# Patient Record
Sex: Female | Born: 1960 | Race: White | Hispanic: No | Marital: Married | Smoking: Never smoker
Health system: Southern US, Community
[De-identification: ages and names within clinical notes are randomized; demographics above are authoritative.]

## PROBLEM LIST (undated history)

## (undated) DIAGNOSIS — R51 Headache: Secondary | ICD-10-CM

## (undated) DIAGNOSIS — B019 Varicella without complication: Secondary | ICD-10-CM

## (undated) DIAGNOSIS — K635 Polyp of colon: Secondary | ICD-10-CM

## (undated) DIAGNOSIS — E785 Hyperlipidemia, unspecified: Secondary | ICD-10-CM

## (undated) DIAGNOSIS — G43909 Migraine, unspecified, not intractable, without status migrainosus: Secondary | ICD-10-CM

## (undated) DIAGNOSIS — K5120 Ulcerative (chronic) proctitis without complications: Secondary | ICD-10-CM

## (undated) DIAGNOSIS — K5090 Crohn's disease, unspecified, without complications: Secondary | ICD-10-CM

## (undated) DIAGNOSIS — E119 Type 2 diabetes mellitus without complications: Secondary | ICD-10-CM

## (undated) DIAGNOSIS — F329 Major depressive disorder, single episode, unspecified: Secondary | ICD-10-CM

## (undated) DIAGNOSIS — K644 Residual hemorrhoidal skin tags: Secondary | ICD-10-CM

## (undated) DIAGNOSIS — K219 Gastro-esophageal reflux disease without esophagitis: Secondary | ICD-10-CM

## (undated) DIAGNOSIS — F32A Depression, unspecified: Secondary | ICD-10-CM

## (undated) HISTORY — PX: HERNIA REPAIR: SHX51

## (undated) HISTORY — DX: Major depressive disorder, single episode, unspecified: F32.9

## (undated) HISTORY — DX: Varicella without complication: B01.9

## (undated) HISTORY — DX: Residual hemorrhoidal skin tags: K64.4

## (undated) HISTORY — DX: Depression, unspecified: F32.A

## (undated) HISTORY — DX: Type 2 diabetes mellitus without complications: E11.9

## (undated) HISTORY — PX: TONSILLECTOMY AND ADENOIDECTOMY: SUR1326

## (undated) HISTORY — DX: Polyp of colon: K63.5

## (undated) HISTORY — DX: Headache: R51

## (undated) HISTORY — DX: Hyperlipidemia, unspecified: E78.5

## (undated) HISTORY — DX: Ulcerative (chronic) proctitis without complications: K51.20

## (undated) HISTORY — DX: Crohn's disease, unspecified, without complications: K50.90

## (undated) HISTORY — DX: Migraine, unspecified, not intractable, without status migrainosus: G43.909

## (undated) HISTORY — DX: Gastro-esophageal reflux disease without esophagitis: K21.9

---

## 1995-04-26 HISTORY — PX: ABDOMINAL HYSTERECTOMY: SHX81

## 1995-04-26 HISTORY — PX: APPENDECTOMY: SHX54

## 2004-04-25 HISTORY — PX: COLON SURGERY: SHX602

## 2009-04-22 ENCOUNTER — Ambulatory Visit: Payer: Self-pay

## 2009-04-22 ENCOUNTER — Ambulatory Visit: Payer: Self-pay | Admitting: Pulmonology

## 2009-04-22 ENCOUNTER — Encounter: Payer: Self-pay | Admitting: Gastroenterology

## 2009-04-22 DIAGNOSIS — I519 Heart disease, unspecified: Secondary | ICD-10-CM | POA: Insufficient documentation

## 2009-04-22 DIAGNOSIS — G4733 Obstructive sleep apnea (adult) (pediatric): Secondary | ICD-10-CM | POA: Insufficient documentation

## 2009-04-22 DIAGNOSIS — K5090 Crohn's disease, unspecified, without complications: Secondary | ICD-10-CM | POA: Insufficient documentation

## 2009-04-22 DIAGNOSIS — I5189 Other ill-defined heart diseases: Secondary | ICD-10-CM | POA: Insufficient documentation
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
## 2009-05-07 ENCOUNTER — Emergency Department: Admit: 2009-05-07 | Disposition: A | Discharge status: Home / Self Care | Payer: Self-pay | Source: Ambulatory Visit

## 2009-05-08 ENCOUNTER — Ambulatory Visit: Payer: Self-pay
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
## 2009-05-11 ENCOUNTER — Institutional Professional Consult (permissible substitution): Payer: Self-pay | Admitting: Sleep Medicine

## 2009-05-15 ENCOUNTER — Ambulatory Visit: Payer: Self-pay

## 2009-05-22 ENCOUNTER — Ambulatory Visit: Payer: Self-pay

## 2009-06-02 ENCOUNTER — Other Ambulatory Visit: Payer: Self-pay
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
## 2009-08-05 ENCOUNTER — Other Ambulatory Visit: Payer: Self-pay

## 2009-10-19 ENCOUNTER — Ambulatory Visit: Payer: Self-pay | Admitting: Pulmonology

## 2009-10-19 ENCOUNTER — Ambulatory Visit: Payer: Self-pay

## 2010-04-28 ENCOUNTER — Ambulatory Visit
Admit: 2010-04-28 | Discharge: 2010-04-28 | Disposition: A | Discharge status: Home / Self Care | Payer: Self-pay | Source: Ambulatory Visit | Attending: Obstetrics | Admitting: Obstetrics

## 2010-05-02 NOTE — Miscellaneous (Unsigned)
Continuity of Care Record  Created: todo  From: Hermina Barters  From:   From: [employer], EHR v10.2.7.53  To: [name]  Purpose: Patient Use;       Problems  Diagnosis: Crohn's Disease (555.9)   Diagnosis: Diastolic Dysfunction (429.9)   Diagnosis: Probable Obstructive Sleep Apnea (327.23)     Alerts  Allergy - Latex-asked/denied   Allergy - No Known Drug Allergy     Medications  Accu-Chek Aviva Strip ; RPT   Accu-Chek Multiclix Lancets Miscellaneous ; RPT   Advair Diskus 100-50 MCG/DOSE Aerosol Powder Breath Activated; 1 puff bid ;   RPT   Apriso 0.375 GM Capsule Extended Release 24 Hour; TAKE 4 CAPSULES DAILY IN   THE MORNING. ; RPT   Dicyclomine HCl 20 MG Tablet; TAKE 1 TABLET EVERY 8 HOURS PRN abdominal   pain ; RPT   Glimepiride 1 MG Tablet; TAKE 1 TABLET TWICE DAILY ; RPT   Hydrocodone-Acetaminophen 5-500 MG CAPS; TAKE 1 CAPSULE EVERY 8 HOURS AS   NEEDED FOR PAIN. ; RPT   Hydrocodone-Acetaminophen 7.5-325 MG Tablet ; RPT   HydrOXYzine HCl 25 MG Tablet; TAKE 1 TABLET BEDTIME PRN ; RPT   Ibuprofen 600 MG Tablet ; RPT   Januvia 50 MG Tablet; TAKE 1 TABLET DAILY. ; RPT   Lantus SoloStar 100 UNIT/ML Solution ; RPT   LORazepam 0.5 MG Tablet; TAKE 1 TABLET 3 TIMES DAILY AS NEEDED. ; RPT   Metoprolol Tartrate 50 MG Tablet; TAKE 1 TABLET TWICE DAILY. ; RPT   Nortriptyline HCl 25 MG Capsule; TAKE 1 CAPSULE BEDTIME PRN ; RPT   NovoFine 32G X 6 MM Miscellaneous ; RPT   NovoLOG Mix 70/30 FlexPen 70-30 % Suspension ; RPT   Omeprazole 20 MG Capsule Delayed Release ; RPT   PARoxetine HCl 20 MG Tablet ; RPT   Prochlorperazine Maleate 10 MG Tablet ; RPT   Topiramate 100 MG Tablet; TAKE 1 TABLET DAILY. ; RPT   Ventolin HFA AERS; INHALE 1 TO 2 PUFFS EVERY 6 HOURS AS NEEDED. ; RPT

## 2010-05-03 LAB — GYN CYTOLOGY

## 2010-05-03 LAB — HPV DNA PROBE WITH CYTOLOGY: HPV Hybrid Capture: NEGATIVE

## 2012-06-28 ENCOUNTER — Ambulatory Visit (INDEPENDENT_AMBULATORY_CARE_PROVIDER_SITE_OTHER): Payer: BC Managed Care – PPO | Admitting: Family Medicine

## 2012-06-28 ENCOUNTER — Encounter: Payer: Self-pay | Admitting: Family Medicine

## 2012-06-28 VITALS — BP 124/90 | HR 103 | Temp 98.7°F | Ht 67.5 in | Wt 219.0 lb

## 2012-06-28 DIAGNOSIS — E785 Hyperlipidemia, unspecified: Secondary | ICD-10-CM | POA: Insufficient documentation

## 2012-06-28 DIAGNOSIS — M7071 Other bursitis of hip, right hip: Secondary | ICD-10-CM

## 2012-06-28 DIAGNOSIS — F329 Major depressive disorder, single episode, unspecified: Secondary | ICD-10-CM

## 2012-06-28 DIAGNOSIS — E1142 Type 2 diabetes mellitus with diabetic polyneuropathy: Secondary | ICD-10-CM

## 2012-06-28 DIAGNOSIS — M76899 Other specified enthesopathies of unspecified lower limb, excluding foot: Secondary | ICD-10-CM

## 2012-06-28 DIAGNOSIS — K5090 Crohn's disease, unspecified, without complications: Secondary | ICD-10-CM

## 2012-06-28 DIAGNOSIS — E1149 Type 2 diabetes mellitus with other diabetic neurological complication: Secondary | ICD-10-CM

## 2012-06-28 LAB — BASIC METABOLIC PANEL
BUN: 14 mg/dL (ref 6–23)
Chloride: 101 mEq/L (ref 96–112)
Glucose, Bld: 265 mg/dL — ABNORMAL HIGH (ref 70–99)
Potassium: 4.7 mEq/L (ref 3.5–5.3)

## 2012-06-28 LAB — LIPID PANEL
LDL Cholesterol: 50 mg/dL (ref 0–99)
Triglycerides: 321 mg/dL — ABNORMAL HIGH (ref <150)
VLDL: 64 mg/dL — ABNORMAL HIGH (ref 0–40)

## 2012-06-28 NOTE — Patient Instructions (Addendum)
-  We have ordered labs or studies at this visit. It can take up to 1-2 weeks for results and processing. We will contact you with instructions IF your results are abnormal. Normal results will be released to your [facility]. If you have not heard from [place] or can not find your results in [facility] in 2 weeks please contact our office.  -PLEASE SIGN UP FOR MYCHART TODAY   We recommend the following healthy lifestyle measures: - eat a healthy diet consisting of lots of vegetables, fruits, beans, nuts, seeds, healthy meats such as white chicken and fish and whole grains.  - avoid fried foods, fast food, processed foods, sodas, red meet and other fattening foods.  - get a least 150 minutes of aerobic exercise per week.   For your diabetes:  1) continue lantus and meformin 2) take 5 units of novolog with meals 3)check FASTING blood sugar (first thing in the morning before eating) and keep log 4) check POSTPRANDIAL blood sugars (2 hours after a meal) and keep log -We placed a referral for you as discussed to the endocrinologist. It usually takes about 1-2 weeks to process and schedule this referral. If you have not heard from [place] regarding this appointment in 2 weeks please contact our office.  We placed a referral to the gastroenterologist for you.  For the hip pain: 1)ice for 15 minutes 2 times daily 2) topical sports creams 3) tylenol (500-1000mg ) upt to 3 times daily 4) exercise program provided, start with the stretches for the first week and then add the other exercises  Follow up in: 2 months

## 2012-06-28 NOTE — Progress Notes (Signed)
Chief Complaint  Patient presents with  . Sciatica  . Bursitis    HPI:  Ashley Velazquez is here to establish care. Recently moved here from [place]. Last PCP and physical: Feb 12 with pap and all normal.   Has the following chronic problems and concerns today:  Patient Active Problem List  Diagnosis  . Depression  . Hyperlipemia  . Crohn's disease  . Uncontrolled type 2 diabetes mellitus with peripheral neuropathy   R lateral hip pain: -started a few days ago after a long hike -worse with certain movements -hx of hip bursitis and sciatica -denies: fevers, chills, weakness, numbness  DM: -has had DM for two years -reports until the last 3 weeks diabetes was really out of control with BS in the 500s until got back on medications 3 weeks ago when husband got insurance -taking metformin 1000mg  bid -lantus 80 units daily at night -checking sugars before meals and using sliding scale novolog insulin 5-8 units before meals - but would rather carb count or do a simpler regimen -fasting morning BS ave 100, before meals 120-150s -would like to see an endocrinologist -walking dogs daily about 10 minutes -she is trying to improve diet, she has lost some weight, watching portions -does have some loss of sensation in feet and neuropathy in hands and checks feet frequently  Depression: -on citalopram and Wellbutrin about one years -stable and has been doing well -has not done counseling -has not had SI or hospitalization in the past -goes through spells where depression is worse -denies current instability, depression, hopelessness, SI  HLD: -stable on atorvastatin  Crohn's: -hx of colon resection in 2006 and multiple surgeries after this -wants to get plugged in with a GI doctor here, was followed closely by GI in [place] -not on any medications currenlty -has pains in abd after eating from time to time and when this happens goes on liquid diet for a few days -hx of surgical  adhesions   ROS: See pertinent positives and negatives per HPI.  Past Medical History  Diagnosis Date  . Diabetes mellitus without complication   . Hyperlipidemia   . Crohn disease   . Depression     Family History  Problem Relation Age of Onset  . Cancer Mother     uterin cancer  . Hyperlipidemia Mother   . Alcohol abuse Father   . Cancer Father     lung and brain  . Hyperlipidemia Father   . Heart disease Father   . Hypertension Father   . Drug abuse Maternal Grandmother   . Drug abuse Maternal Grandfather     History   Social History  . Marital Status: Married    Spouse Name: N/A    Number of Children: N/A  . Years of Education: N/A   Social History Main Topics  . Smoking status: Never Smoker   . Smokeless tobacco: None  . Alcohol Use: Yes  . Drug Use: No  . Sexually Active: None   Other Topics Concern  . None   Social History Narrative   Work or School: on permanent disability after multiple surgeries for crohn's disease      Home Situation: lives with husband      Spiritual Beliefs:      Lifestyle: walks daily 10 minutes, working on improving diet                Current outpatient prescriptions:ACCU-CHEK AVIVA PLUS test strip, 1 each by Other route 3 (three) times daily.,  Disp: , Rfl: ;  atorvastatin (LIPITOR) 40 MG tablet, Take 1 tablet by mouth daily., Disp: , Rfl: ;  buPROPion (WELLBUTRIN SR) 150 MG 12 hr tablet, Take 150 mg by mouth daily., Disp: , Rfl: ;  citalopram (CELEXA) 20 MG tablet, Take 1 tablet by mouth daily., Disp: , Rfl:  Lancets (ACCU-CHEK MULTICLIX) lancets, 1 each by Other route 3 (three) times daily., Disp: , Rfl: ;  LANTUS SOLOSTAR 100 UNIT/ML injection, Inject 80 Units into the skin at bedtime., Disp: , Rfl: ;  metFORMIN (GLUCOPHAGE) 1000 MG tablet, Take 1,000 mg by mouth 2 (two) times daily with a meal., Disp: , Rfl: ;  NOVOFINE 32G X 6 MM MISC, 1 each by Other route daily., Disp: , Rfl: ;  NOVOLOG FLEXPEN 100 UNIT/ML injection,  Sliding scale, Disp: , Rfl:   EXAM:  Filed Vitals:   06/28/12 1618  BP: 124/90  Pulse: 103  Temp: 98.7 F (37.1 C)    Body mass index is 33.77 kg/(m^2).  GENERAL: vitals reviewed and listed above, alert, oriented, appears well hydrated and in no acute distress  HEENT: atraumatic, conjunttiva clear, no obvious abnormalities on inspection of external nose and ears  NECK: no obvious masses on inspection  LUNGS: clear to auscultation bilaterally, no wheezes, rales or rhonchi, good air movement  CV: HRRR, no peripheral edema  MS: moves all extremities without noticeable abnormality TTP over R trochanteric bursa Mild difuse TTP low back and glutes Normal MS, DTRs, sensation bilat LEs Neg: facet loading, leg length discrepancy, SLFT, CLRT, FABER, FADIR  PSYCH: pleasant and cooperative, no obvious depression or anxiety  ASSESSMENT AND PLAN:  Discussed the following assessment and plan:  Uncontrolled type 2 diabetes mellitus with peripheral neuropathy - Plan: Ambulatory referral to Endocrinology, Basic metabolic panel, Hemoglobin A1c, Microalbumin / creatinine urine ratio, CANCELED: Hemoglobin A1c, CANCELED: Basic metabolic panel, CANCELED: Microalbumin/Creatinine Ratio, Urine  Crohn's disease - Plan: Ambulatory referral to Gastroenterology  Depression  Hyperlipemia - Plan: Lipid panel, CANCELED: Lipid Panel  Hip bursitis, right  -We reviewed the PMH, PSH, FH, SH, Meds and Allergies. -We provided refills for any medications we will prescribe as needed. -We addressed current concerns per orders and patient instructions. -We have asked for records for pertinent exams, studies, vaccines and notes from previous providers. -hip pain c/w trochanteric bursitis and muscle soreness - tx per below, advised against inj first line given DM  -discussed her DM at length including education on disease process and insulin, diet and exercise - she would like to get plugged in with endocrine  here and referral placed. In the meantime will have her check fasting and postprandial BS and will check HgbA1c today - but is likely to be quite high per report of very high BS until restarted meds 3 weeks ago. Current home BS much improved. -congratulated on lifestyle changes and better management of diabetes -depression, stable and advised if worsening should add counseling -referred to GI for her hx of complicated crohn's disease  -NON-FASTING LABS -We have advised patient to follow up per instructions below. ->45 minutes spent face to face with this patient   -Patient advised to return or notify a doctor immediately if symptoms worsen or persist or new concerns arise.  Patient Instructions  -We have ordered labs or studies at this visit. It can take up to 1-2 weeks for results and processing. We will contact you with instructions IF your results are abnormal. Normal results will be released to your [facility]. If you  have not heard from [place] or can not find your results in [facility] in 2 weeks please contact our office.  -PLEASE SIGN UP FOR MYCHART TODAY   We recommend the following healthy lifestyle measures: - eat a healthy diet consisting of lots of vegetables, fruits, beans, nuts, seeds, healthy meats such as white chicken and fish and whole grains.  - avoid fried foods, fast food, processed foods, sodas, red meet and other fattening foods.  - get a least 150 minutes of aerobic exercise per week.   For your diabetes:  1) continue lantus and meformin 2) take 5 units of novolog with meals 3)check FASTING blood sugar (first thing in the morning before eating) and keep log 4) check POSTPRANDIAL blood sugars (2 hours after a meal) and keep log -We placed a referral for you as discussed to the endocrinologist. It usually takes about 1-2 weeks to process and schedule this referral. If you have not heard from [place] regarding this appointment in 2 weeks please contact our office.  We placed a  referral to the gastroenterologist for you.  For the hip pain: 1)ice for 15 minutes 2 times daily 2) topical sports creams 3) tylenol (500-1000mg ) upt to 3 times daily 4) exercise program provided, start with the stretches for the first week and then add the other exercises  Follow up in: 2 months      KIM, HANNAH R.

## 2012-06-29 LAB — MICROALBUMIN / CREATININE URINE RATIO: Microalb, Ur: 27.28 mg/dL — ABNORMAL HIGH (ref 0.00–1.89)

## 2012-07-02 ENCOUNTER — Telehealth: Payer: Self-pay | Admitting: Family Medicine

## 2012-07-02 NOTE — Telephone Encounter (Signed)
Called and spoke with pt an appt is aware.  Appt made for 09/03/2012 and card sent to pt's home address.

## 2012-07-02 NOTE — Telephone Encounter (Signed)
Labs indicate: Diabetes is very uncontrolled and is damaging kidneys. Diet and exercise will be extremely improtant. She was referred to the endocrinologist for her diabetes. Follow up with me in 2 months.

## 2012-07-05 ENCOUNTER — Encounter: Payer: Self-pay | Admitting: Internal Medicine

## 2012-07-19 ENCOUNTER — Encounter: Payer: Self-pay | Admitting: Internal Medicine

## 2012-07-19 ENCOUNTER — Ambulatory Visit: Payer: BC Managed Care – PPO | Admitting: Internal Medicine

## 2012-07-23 ENCOUNTER — Ambulatory Visit: Payer: BC Managed Care – PPO | Admitting: Internal Medicine

## 2012-08-02 ENCOUNTER — Ambulatory Visit (INDEPENDENT_AMBULATORY_CARE_PROVIDER_SITE_OTHER): Payer: BC Managed Care – PPO | Admitting: Internal Medicine

## 2012-08-02 ENCOUNTER — Encounter: Payer: Self-pay | Admitting: Internal Medicine

## 2012-08-02 VITALS — BP 110/64 | HR 98 | Temp 98.2°F | Resp 10 | Ht 67.5 in | Wt 223.0 lb

## 2012-08-02 DIAGNOSIS — E1142 Type 2 diabetes mellitus with diabetic polyneuropathy: Secondary | ICD-10-CM

## 2012-08-02 DIAGNOSIS — E1149 Type 2 diabetes mellitus with other diabetic neurological complication: Secondary | ICD-10-CM

## 2012-08-02 NOTE — Progress Notes (Signed)
Subjective:     Patient ID: Ashley Velazquez, female   DOB: 12-17-1960, 52 y.o.   MRN: [id number]  HPI Ms. Eilers is a pleasant 52 year old woman, referred by her PCP, Dr. Selena Batten, for management of DM 2, uncontrolled, insulin-dependent, with complications (peripheral neuropathy).  She was dx with DM2 in 2012. She was started on Insulin 6 mo after dx.  She is on: - Lantus 80 units in HS - Novolog: but SSI, but recently started on 5 units before each meal (she remembers to take it with lunch and b'fast).  - Metformin 1000 mg bid She forgets to take the NovoLog and Metformin with dinner, but takes it in am consistently. She does not forget to take her Lantus. She was off meds Aug-Dec 2013 b/c of insurance. At that time, she had blurry vision, increased thirst and urination, and she was drinking at least a regular soda bottle a day. She is having problems with bringing her sugars under control.   She checks her sugars 3x a day: - am: (occ. 86-)112-130 - 2h after lunch: 150-300s Lowest 86, hypoglycemia awareness <100. Highest: HI, when off meds.   Her diet is: - Breakfast: bowl of cereals - Lunch: sandwich - Dinner: meat and vegetables - Snacks: 2-3 a day: Rice cakes, bananas, oranges, yogurt or Jell-O  She has peripheral neuropathy, very dry skin on feet, left shoulder pain and left arm numbness, No CKD, but had MAU at last check, around 150. Last eye exam 2 mo ago.   She has FH of DM2 in 2 aunts.   Review of Systems Constitutional: no weight gain/loss, + fatigue, no subjective hyperthermia/hypothermia, she has poor sleep Eyes: no blurry vision, no xerophthalmia ENT: no sore throat, no nodules palpated in throat, no dysphagia/odynophagia, no hoarseness Cardiovascular: no CP/SOB/palpitations/swelling in hands Respiratory: no cough/+ SOB Gastrointestinal: no N/V/D/C Musculoskeletal: + both muscle/joint aches Skin: no rashes, + itching and hair loss Neurological: no  tremors/numbness/tingling/dizziness, hypoacusis, has headaches Psychiatric: has depression/no anxiety Low libido Past Medical History  Diagnosis Date  . Diabetes mellitus without complication   . Hyperlipidemia   . Crohn disease   . Depression   . Chicken pox   . Depression   . Headache     frequent  . Migraines    Past Surgical History  Procedure Laterality Date  . Abdominal hysterectomy  1997  . Colon surgery    . Appendectomy    . Tonsillectomy    . Tonsillectomy and adenoidectomy     History   Social History  . Marital Status: Married    Spouse Name: N/A    Number of Children: N/A  . Years of Education: N/A   Occupational History  . Not on file.   Social History Main Topics  . Smoking status: Never Smoker   . Smokeless tobacco: Not on file  . Alcohol Use: Yes  . Drug Use: No  . Sexually Active: Not on file   Other Topics Concern  . Not on file   Social History Narrative   Work or School: on permanent disability after multiple surgeries for crohn's disease      Home Situation: lives with husband      Spiritual Beliefs:      Lifestyle: walks daily 10 minutes, working on improving diet               Current Outpatient Prescriptions on File Prior to Visit  Medication Sig Dispense Refill  . ACCU-CHEK AVIVA PLUS test strip 1  each by Other route 3 (three) times daily.      Marland Kitchen atorvastatin (LIPITOR) 40 MG tablet Take 1 tablet by mouth daily.      Marland Kitchen buPROPion (WELLBUTRIN SR) 150 MG 12 hr tablet Take 150 mg by mouth daily.      . citalopram (CELEXA) 20 MG tablet Take 1 tablet by mouth daily.      . Lancets (ACCU-CHEK MULTICLIX) lancets 1 each by Other route 3 (three) times daily.      Marland Kitchen LANTUS SOLOSTAR 100 UNIT/ML injection Inject 80 Units into the skin at bedtime.      . metFORMIN (GLUCOPHAGE) 1000 MG tablet Take 1,000 mg by mouth 2 (two) times daily with a meal.      . NOVOFINE 32G X 6 MM MISC 1 each by Other route daily.      Marland Kitchen NOVOLOG FLEXPEN 100  UNIT/ML injection Sliding scale       No current facility-administered medications on file prior to visit.   Allergies  Allergen Reactions  . Cymbalta (Duloxetine Hcl) Other (See Comments)    tachycardia   Family History  Problem Relation Age of Onset  . Cancer Mother     uterin cancer  . Hyperlipidemia Mother   . Alcohol abuse Father   . Cancer Father     lung and brain  . Hyperlipidemia Father   . Heart disease Father   . Hypertension Father   . Drug abuse Maternal Grandmother   . Drug abuse Maternal Grandfather    Objective:   Physical Exam BP 110/64  Pulse 98  Temp(Src) 98.2 F (36.8 C) (Oral)  Resp 10  Ht 5' 7.5" (1.715 m)  Wt 223 lb (101.152 kg)  BMI 34.39 kg/m2  SpO2 97% Wt Readings from Last 3 Encounters:  08/02/12 223 lb (101.152 kg)  06/28/12 219 lb (99.338 kg)   Constitutional: overweight, in NAD Eyes: PERRLA, EOMI, no exophthalmos ENT: moist mucous membranes, no thyromegaly, no cervical lymphadenopathy Cardiovascular: RRR, No MRG Respiratory: CTA B Gastrointestinal: abdomen soft, NT, ND, BS+ Musculoskeletal: no deformities, strength intact in all 4 Skin: moist, warm, no rashes Neurological: no tremor with outstretched hands, DTR normal in all 4  Assessment:     1. DM2, uncontrolled, insulin-dependent, with complications - Peripheral neuropathy     Plan:     Patient with uncontrolled diabetes, accentuated by lack of insurance for several months last year. She is on a regimen of Lantus and NovoLog along with metformin. She was on a sliding scale plus mealtime NovoLog before, however she is now on a smaller (5 units 3 times a day) fixed dose of mealtime insulin. She does not have frequent lows, and she finds herself mostly hyperglycemic. - we discussed about the fact that the physiologic diabetic regimen would have approximately equal mealtime and basal total daily dose of insulin, so today I will decrease her Lantus dose to 50 units, and will also  increase her NovoLog insulin from 5 units to 10 units 3 times a day with meals. We will also add a sliding scale with a target of 150, and insulin sensitivity factor of 20. - I explained that the best way to constellate her mealtime insulin is by carb counting and having a good insulin to carb ratio, so I will refer her to nutrition, for further information about carb counting and education about healthy eating - given sugar log and advised how to fill it and to bring it at next appt - given foot  care handout and explained the principles - given instructions for hypoglycemia management "15-15 rule" - I will see her back in a month with her sugar log, but I advised her to join my chart and to send me her sugars through there

## 2012-08-02 NOTE — Patient Instructions (Addendum)
Please return in 1 month with your sugar log.  Please: - decrease the Lantus to 50 units. - increase the NovoLog to 10 units of insulin before each meal. - add the following NovoLog Sliding Scale: 150-170: +1 unit 171-190: +2 units 191-210: + 3 units 211-230: + 4 units 231-250: + 5 units 251-270: + 6 units 271 or above: + 7 units  Please let me know about your sugars through my chart, especially if they stay high or decrease to <70.  PATIENT INSTRUCTIONS FOR TYPE 2 DIABETES:  **Please join MyChart!** - see attached instructions about how to join.  DIET AND EXERCISE Diet and exercise is an important part of diabetic treatment.  We recommended aerobic exercise in the form of brisk walking (working between 40-60% of maximal aerobic capacity, similar to brisk walking) for 150 minutes per week (such as 30 minutes five days per week) along with 3 times per week performing 'resistance' training (using various gauge rubber tubes with handles) 5-10 exercises involving the major muscle groups (upper body, lower body and core) performing 10-15 repetitions (or near fatigue) each exercise. Start at half the above goal but build slowly to reach the above goals. If limited by weight, joint pain, or disability, we recommend daily walking in a swimming pool with water up to waist to reduce pressure from joints while allow for adequate exercise.    BLOOD GLUCOSES Monitoring your blood glucoses is important for continued management of your diabetes. Please check your blood glucoses 2-4 times a day: fasting, before meals and at bedtime (you can rotate these measurements - e.g. one day check before the 3 meals, the next day check before 2 of the meals and before bedtime, etc.   HYPOGLYCEMIA (low blood sugar) Hypoglycemia is usually a reaction to not eating, exercising, or taking too much insulin/ other diabetes drugs.  Symptoms include tremors, sweating, hunger, confusion, headache, etc. Treat IMMEDIATELY  with 15 grams of Carbs:   4 glucose tablets    cup regular juice/soda   2 tablespoons raisins   4 teaspoons sugar   1 tablespoon honey Recheck blood glucose in 15 mins and repeat above if still symptomatic/blood glucose <100. Please contact our office at [phone] if you have questions about how to next handle your insulin.  RECOMMENDATIONS TO REDUCE YOUR RISK OF DIABETIC COMPLICATIONS: * Take your prescribed MEDICATION(S). * Follow a DIABETIC diet: Complex carbs, fiber rich foods, heart healthy fish twice weekly, (monounsaturated and polyunsaturated) fats * AVOID saturated/trans fats, high fat foods, >2,300 mg salt per day. * EXERCISE at least 5 times a week for 30 minutes or preferably daily.  * DO NOT SMOKE OR DRINK more than 1 drink a day. * Check your FEET every day. Do not wear tightfitting shoes. Contact us if you develop an ulcer * See your EYE doctor once a year or more if needed * Get a FLU shot once a year * Get a PNEUMONIA vaccine once before and once after age 77 years  GOALS:  * Your Hemoglobin A1c of <7%  * Your Systolic BP should be 140 or lower  * Your Diastolic BP should be 80 or lower  * Your HDL (Good Cholesterol) should be 40 or higher  * Your LDL (Bad Cholesterol) should be 100 or lower  * Your Triglycerides should be 150 or lower  * Your Urine microalbumin (kidney function) should be <30 * Your Body Mass Index should be 25 or lower   We will be glad  to help you achieve these goals. Our telephone number is: [phone].

## 2012-08-21 ENCOUNTER — Telehealth: Payer: Self-pay

## 2012-08-21 DIAGNOSIS — E1165 Type 2 diabetes mellitus with hyperglycemia: Secondary | ICD-10-CM

## 2012-08-21 MED ORDER — INSULIN GLARGINE 100 UNIT/ML SOLOSTAR PEN: 50.0000 [IU] | PEN_INJECTOR | Freq: Every day | SUBCUTANEOUS | Status: DC

## 2012-08-21 MED ORDER — ACCU-CHEK MULTICLIX LANCETS MISC: Status: AC

## 2012-08-21 MED ORDER — GLUCOSE BLOOD VI STRP: ORAL_STRIP | Status: DC

## 2012-08-21 NOTE — Telephone Encounter (Signed)
Rx request for Lantus Solostar 100 u/ml inj.  Pls advise on refill.

## 2012-08-28 ENCOUNTER — Ambulatory Visit: Payer: BC Managed Care – PPO | Admitting: Dietician

## 2012-08-31 ENCOUNTER — Ambulatory Visit: Payer: BC Managed Care – PPO | Admitting: Internal Medicine

## 2012-09-03 ENCOUNTER — Ambulatory Visit: Payer: BC Managed Care – PPO | Admitting: Family Medicine

## 2012-09-06 ENCOUNTER — Ambulatory Visit: Payer: BC Managed Care – PPO | Admitting: Internal Medicine

## 2012-09-07 ENCOUNTER — Other Ambulatory Visit: Payer: Self-pay

## 2012-09-07 MED ORDER — METFORMIN HCL 1000 MG PO TABS: 1000.0000 mg | ORAL_TABLET | Freq: Two times a day (BID) | ORAL | Status: DC

## 2012-09-07 NOTE — Telephone Encounter (Signed)
rx sent to pharmacy

## 2012-09-18 ENCOUNTER — Encounter: Payer: Self-pay | Admitting: Internal Medicine

## 2012-09-19 ENCOUNTER — Encounter: Payer: Self-pay | Admitting: Internal Medicine

## 2012-09-21 ENCOUNTER — Encounter: Payer: Self-pay | Admitting: Internal Medicine

## 2012-09-21 ENCOUNTER — Ambulatory Visit (INDEPENDENT_AMBULATORY_CARE_PROVIDER_SITE_OTHER): Payer: BC Managed Care – PPO | Admitting: Internal Medicine

## 2012-09-21 ENCOUNTER — Other Ambulatory Visit (INDEPENDENT_AMBULATORY_CARE_PROVIDER_SITE_OTHER): Payer: BC Managed Care – PPO

## 2012-09-21 VITALS — BP 102/70 | HR 72 | Ht 67.5 in | Wt 229.0 lb

## 2012-09-21 DIAGNOSIS — R110 Nausea: Secondary | ICD-10-CM

## 2012-09-21 DIAGNOSIS — K219 Gastro-esophageal reflux disease without esophagitis: Secondary | ICD-10-CM

## 2012-09-21 DIAGNOSIS — K5120 Ulcerative (chronic) proctitis without complications: Secondary | ICD-10-CM | POA: Insufficient documentation

## 2012-09-21 DIAGNOSIS — Z8719 Personal history of other diseases of the digestive system: Secondary | ICD-10-CM

## 2012-09-21 LAB — SEDIMENTATION RATE: Sed Rate: 12 mm/hr (ref 0–22)

## 2012-09-21 LAB — C-REACTIVE PROTEIN: CRP: 0.9 mg/dL (ref 0.5–20.0)

## 2012-09-21 LAB — CBC
HCT: 40.7 % (ref 36.0–46.0)
Hemoglobin: 14.1 g/dL (ref 12.0–15.0)
RBC: 4.79 Mil/uL (ref 3.87–5.11)
WBC: 9.1 10*3/uL (ref 4.5–10.5)

## 2012-09-21 LAB — COMPREHENSIVE METABOLIC PANEL
CO2: 27 mEq/L (ref 19–32)
Calcium: 10 mg/dL (ref 8.4–10.5)
Chloride: 103 mEq/L (ref 96–112)
Creatinine, Ser: 0.7 mg/dL (ref 0.4–1.2)
GFR: 93.98 mL/min (ref >60.00)
Glucose, Bld: 234 mg/dL — ABNORMAL HIGH (ref 70–99)
Sodium: 137 mEq/L (ref 135–145)
Total Bilirubin: 0.7 mg/dL (ref 0.3–1.2)
Total Protein: 8 g/dL (ref 6.0–8.3)

## 2012-09-21 MED ORDER — TRAMADOL HCL 50 MG PO TABS: 50.0000 mg | ORAL_TABLET | Freq: Four times a day (QID) | ORAL | Status: DC | PRN

## 2012-09-21 MED ORDER — PANTOPRAZOLE SODIUM 40 MG PO TBEC: 40.0000 mg | DELAYED_RELEASE_TABLET | Freq: Every day | ORAL | Status: DC

## 2012-09-21 MED ORDER — ONDANSETRON 4 MG PO TBDP: 4.0000 mg | ORAL_TABLET | Freq: Three times a day (TID) | ORAL | Status: AC | PRN

## 2012-09-21 NOTE — Patient Instructions (Addendum)
You have been scheduled for a colonoscopy with propofol. Please follow written instructions given to you at your visit today.  Please pick up your prep kit at the pharmacy within the next 1-3 days. If you use inhalers (even only as needed), please bring them with you on the day of your procedure. Your physician has requested that you go to www.startemmi.com and enter the access code given to you at your visit today. This web site gives a general overview about your procedure. However, you should still follow specific instructions given to you by our office regarding your preparation for the procedure.  Your physician has requested that you go to the basement for  lab work before leaving today.  You have been scheduled for a CT scan of the abdomen and pelvis at [facility] ([address]---this is in the same building as Architectural technologist).   You are scheduled on 09/26/2012 at 9:30am. You should arrive 15 minutes prior to your appointment time for registration. Please follow the written instructions below on the day of your exam:  WARNING: IF YOU ARE ALLERGIC TO IODINE/X-RAY DYE, PLEASE NOTIFY RADIOLOGY IMMEDIATELY AT [phone]! YOU WILL BE GIVEN A 13 HOUR PREMEDICATION PREP.  1) Do not eat or drink anything after 5:30am (4 hours prior to your test) 2) You have been given 2 bottles of oral contrast to drink. The solution may taste Better if refrigerated, but do NOT add ice or any other liquid to this solution. Shake well before drinking.    Drink 1 bottle of contrast @ 7:30 (2 hours prior to your exam)  Drink 1 bottle of contrast @ 8:30 (1 hour prior to your exam)  You may take any medications as prescribed with a small amount of water except for the following: Metformin, Glucophage, Glucovance, Avandamet, Riomet, Fortamet, Actoplus Met, Janumet, Glumetza or Metaglip. The above medications must be held the day of the exam AND 48 hours after the exam.  The purpose of you drinking  the oral contrast is to aid in the visualization of your intestinal tract. The contrast solution may cause some diarrhea. Before your exam is started, you will be given a small amount of fluid to drink. Depending on your individual set of symptoms, you may also receive an intravenous injection of x-ray contrast/dye. Plan on being at [facility] for 30 minutes or long, depending on the type of exam you are having performed.  If you have any questions regarding your exam or if you need to reschedule, you may call the CT department at [phone] between the hours of 8:00 am and 5:00 pm, Monday-Friday.                                               We are excited to introduce MyChart, a new best-in-class service that provides you online access to important information in your electronic medical record. We want to make it easier for you to view your health information - all in one secure location - when and where you need it. We expect MyChart will enhance the quality of care and service we provide.  When you register for MyChart, you can:    View your test results.    Request appointments and receive appointment reminders via email.    Request medication renewals.    View your medical history, allergies, medications  and immunizations.    Communicate with your physician's office through a password-protected site.    Conveniently print information such as your medication lists.  To find out if MyChart is right for you, please talk to a member of our clinical staff today. We will gladly answer your questions about this free health and wellness tool.  If you are age 51 or older and want a member of your family to have access to your record, you must provide written consent by completing a proxy form available at our office. Please speak to our clinical staff about guidelines regarding accounts for patients younger than age 45.  As you activate your MyChart account and need any technical  assistance, please call the MyChart technical support line at [phone]) or email your question to [email or web address]. If you email your question(s), please include your name, a return phone number and the best time to reach you.  If you have non-urgent health-related questions, you can send a message to our office through MyChart at [email or web address]. If you have a medical emergency, call 911.  Thank you for using MyChart as your new health and wellness resource!   MyChart licensed from [employer],  [id number]. Patents Pending.    ________________________________________________________________________

## 2012-09-21 NOTE — Progress Notes (Signed)
Patient ID: Ashley Velazquez, female   DOB: October 14, 1960, 52 y.o.   MRN: [id number] HPI: [name] is a 52 yo female with PMH of ileal Crohn's diagnosed in 2006 status post ileocecectomy in September 2006, subsequent diagnosis of ulcerative proctitis, multiple abdominal surgeries between 2006 and 2008 for hernia repair and lysis of adhesion, chronic abdominal pain, hyperlipidemia, and type 2 diabetes mellitus who is seen in consultation at the request of Dr. Selena Velazquez to establish care regarding IBD.  Patient is alone today. She moved from [place] in November 2013 after her husband changed jobs. She was previously followed by Dr. Tamela Velazquez, her gastroenterologist in [place].    She reports she has been off of all IBD medications for several years. Normally for her she has 2-3 soft but formed stools daily which are nonbloody. She reports a chronic abdominal pain in 3 locations which is left lower quadrant, right lower quadrant and mid abdomen near the umbilicus. Over the past 2-3 days she has developed a diarrheal illness consisting of severe nausea and diarrhea. She reports she is now having 8-10 bowel movements daily which are nonbloody and watery. No recent antibiotics. Her chronic abdominal pain has been worse during this period. She notes 3 months of worsening acid reflux and has been using TUMS frequently. In the past she was taking PPI but this was some time ago. She denies mouth ulcers, no significant rashes, she has reported fairly new development of right hip pain which she has been told is bursitis and left shoulder pain.  Her last colonoscopy was in 2011.  She reports her last visit with her gastroenterologist was summer of 2013  Patient Active Problem List   Diagnosis Date Noted  . Depression 06/28/2012  . Hyperlipemia 06/28/2012  . Crohn's disease 06/28/2012  . Uncontrolled type 2 diabetes mellitus with peripheral neuropathy 06/28/2012    Past Surgical History  Procedure  Laterality Date  . Abdominal hysterectomy  1997  . Colon surgery  2006    partial colectomy  . Appendectomy  1997  . Tonsillectomy and adenoidectomy    . Cesarean section  1993, 1995  . Hernia repair  2006/2008    x2    Current Outpatient Prescriptions  Medication Sig Dispense Refill  . atorvastatin (LIPITOR) 40 MG tablet Take 1 tablet by mouth daily.      Marland Kitchen buPROPion (WELLBUTRIN SR) 150 MG 12 hr tablet Take 150 mg by mouth daily.      . citalopram (CELEXA) 20 MG tablet Take 1 tablet by mouth daily.      Marland Kitchen glucose blood (ACCU-CHEK AVIVA PLUS) test strip Check 3 times daily.  100 each  5  . Insulin Glargine (LANTUS SOLOSTAR) 100 UNIT/ML SOPN Inject 50 Units into the skin at bedtime.  5 pen  11  . Lancets (ACCU-CHEK MULTICLIX) lancets Test blood sugar 3 times daily.  100 each  5  . metFORMIN (GLUCOPHAGE) 1000 MG tablet Take 1 tablet (1,000 mg total) by mouth 2 (two) times daily with a meal.  180 tablet  3  . NOVOFINE 32G X 6 MM MISC 1 each by Other route daily.      Marland Kitchen NOVOLOG FLEXPEN 100 UNIT/ML injection Sliding scale       No current facility-administered medications for this visit.    Allergies  Allergen Reactions  . Cymbalta (Duloxetine Hcl) Other (See Comments)    tachycardia    Family History  Problem Relation Age of Onset  . Uterine cancer  Mother     uterin cancer  . Hyperlipidemia Mother   . Alcohol abuse Father   . Cancer Father     lung and brain  . Hyperlipidemia Father   . Heart disease Father   . Hypertension Father   . Drug abuse Maternal Grandmother   . Drug abuse Maternal Grandfather     History  Substance Use Topics  . Smoking status: Never Smoker   . Smokeless tobacco: Never Used  . Alcohol Use: No    ROS: As per history of present illness, otherwise negative  BP 102/70  Pulse 72  Ht 5' 7.5" (1.715 m)  Wt 229 lb (103.874 kg)  BMI 35.32 kg/m2 Constitutional: Well-developed and well-nourished. No distress. HEENT: Normocephalic and  atraumatic. Oropharynx is clear and moist. No oropharyngeal exudate. Conjunctivae are normal.  No scleral icterus. Neck: Neck supple. Trachea midline. Cardiovascular: Normal rate, regular rhythm and intact distal pulses. No M/R/G Pulmonary/chest: Effort normal and breath sounds normal. No wheezing, rales or rhonchi. Abdominal: Soft, moderate to severe diffuse abdominal pain to light palpation without significant rebound or guarding, nondistended. Bowel sounds active throughout. Multiple well-healed abdominal scars Extremities: no clubbing, cyanosis, or edema Lymphadenopathy: No cervical adenopathy noted. Neurological: Alert and oriented to person place and time. Skin: Skin is warm and dry. No rashes noted. Psychiatric: Normal mood and affect. Behavior is normal.  RELEVANT LABS AND IMAGING:  CMP     Component Value Date/Time   NA 136 06/28/2012 1711   K 4.7 06/28/2012 1711   CL 101 06/28/2012 1711   CO2 24 06/28/2012 1711   GLUCOSE 265* 06/28/2012 1711   BUN 14 06/28/2012 1711   CREATININE 0.65 06/28/2012 1711   CALCIUM 9.4 06/28/2012 1711   EGD 08/26/2010, Dr. Alan Velazquez -- normal examination with biopsies for clo.  Pathology fundic type mucosa with minimal chronic inflammation. Negative for H. Pylori Colonoscopy 03/14/2010, Dr. Alan Velazquez -- exam to the terminal ileum, preparation fair. "Probable intra-abdominal adhesions causing fixity of the colon, and difficulty with intubation. Status post ileocolectomy with 2 apparent ileocolonic anastomosis. One entered and it looked normal. Moderate proctitis. No other evidence of colitis. Pathology = colonic mucosa with moderate chronic inflammation and focal acute cryptitis. Changes are consistent with inflammatory bowel disease. No dysplastic changes are seen.  ASSESSMENT/PLAN: 52 yo female with PMH of ileal Crohn's diagnosed in 2006 status post ileocecectomy in September 2006, subsequent diagnosis of ulcerative proctitis, multiple abdominal surgeries between 2006  and 2008 for hernia repair and lysis of adhesion, chronic abdominal pain, hyperlipidemia, and type 2 diabetes mellitus who is seen in consultation at the request of Dr. Selena Velazquez to establish care regarding IBD.  1.  Ileocolonic Crohn's/ulcerative proctitis/chronic abdominal pain felt possibly functional by previous GI M.D. -- it seems that she has an acute illness today which is difficult to determine if this is inflammatory bowel-related or not. She is having significant abdominal pain and diarrhea. I recommended stool studies today to include C. difficile, culture, ova and Pearson I, and fecal leukocytes. I also recommended CT scan of the abdomen and pelvis with contrast to better evaluate her pain and to determine if there is obvious active IBD.  His stool studies and imaging studies are negative, we will proceed to colonoscopy. We discussed the test today including the risks and benefits and she is agreeable proceed. At this time we need further objective evidence given her history before making any determination on how to proceed therapeutically.  She is unsure if she's had  previous Pneumovax, and we will try to obtain these records. With inflammatory bowel disease she would qualify for vaccination with Pneumovax. She also needs bone mineral density testing. -We will check labs today to include CBC, CMP, ESR and CRP  -I will give her a prescription for tramadol 50-100 mg every 6 hours when necessary pain -Zofran 4 mg every 6-8 hours when necessary nausea  2.  GERD -- daily symptoms, I recommended pantoprazole 40 mg daily for now. No other alarm symptoms to warrant upper endoscopy at this time. Previous upper endoscopy negative on 08/26/2010

## 2012-09-25 ENCOUNTER — Ambulatory Visit: Payer: BC Managed Care – PPO

## 2012-09-25 DIAGNOSIS — R110 Nausea: Secondary | ICD-10-CM

## 2012-09-25 DIAGNOSIS — Z8719 Personal history of other diseases of the digestive system: Secondary | ICD-10-CM

## 2012-09-25 DIAGNOSIS — K219 Gastro-esophageal reflux disease without esophagitis: Secondary | ICD-10-CM

## 2012-09-26 ENCOUNTER — Ambulatory Visit (INDEPENDENT_AMBULATORY_CARE_PROVIDER_SITE_OTHER)
Admission: RE | Admit: 2012-09-26 | Discharge: 2012-09-26 | Disposition: A | Discharge status: Home / Self Care | Payer: BC Managed Care – PPO | Source: Ambulatory Visit | Attending: Internal Medicine | Admitting: Internal Medicine

## 2012-09-26 DIAGNOSIS — K219 Gastro-esophageal reflux disease without esophagitis: Secondary | ICD-10-CM

## 2012-09-26 DIAGNOSIS — R110 Nausea: Secondary | ICD-10-CM

## 2012-09-26 DIAGNOSIS — Z8719 Personal history of other diseases of the digestive system: Secondary | ICD-10-CM

## 2012-09-26 LAB — CLOSTRIDIUM DIFFICILE BY PCR: Toxigenic C. Difficile by PCR: NOT DETECTED

## 2012-09-26 LAB — OVA AND PARASITE SCREEN: OP: NONE SEEN

## 2012-09-26 LAB — FECAL LACTOFERRIN, QUANT: Lactoferrin: POSITIVE

## 2012-09-26 MED ORDER — IOHEXOL 300 MG/ML  SOLN
100.0000 mL | Freq: Once | INTRAMUSCULAR | Status: AC | PRN
  Administered 2012-09-26: 100 mL via INTRAVENOUS

## 2012-09-28 ENCOUNTER — Telehealth: Payer: Self-pay | Admitting: *Deleted

## 2012-09-28 MED ORDER — PEG-KCL-NACL-NASULF-NA ASC-C 100 G PO SOLR: 1.0000 | Freq: Once | ORAL | Status: DC

## 2012-09-28 NOTE — Telephone Encounter (Signed)
Message copied by Florene Glen on Fri Sep 28, 2012  2:58 PM ------      Message from: Beverley Fiedler      Created: Thu Sep 27, 2012  1:28 PM       Stool studies show positive wbc's, which can suggest inflammation      C. difficile, of and parasite, and culture negative ------

## 2012-09-28 NOTE — Telephone Encounter (Signed)
Informed pt of stool studies. Pt states the Movi Prep was never ordered; ordered.

## 2012-09-29 LAB — STOOL CULTURE

## 2012-10-01 ENCOUNTER — Ambulatory Visit (INDEPENDENT_AMBULATORY_CARE_PROVIDER_SITE_OTHER)
Admission: RE | Admit: 2012-10-01 | Discharge: 2012-10-01 | Disposition: A | Discharge status: Home / Self Care | Payer: BC Managed Care – PPO | Source: Ambulatory Visit | Attending: Family Medicine | Admitting: Family Medicine

## 2012-10-01 ENCOUNTER — Encounter: Payer: Self-pay | Admitting: Family Medicine

## 2012-10-01 ENCOUNTER — Ambulatory Visit (INDEPENDENT_AMBULATORY_CARE_PROVIDER_SITE_OTHER): Payer: BC Managed Care – PPO | Admitting: Family Medicine

## 2012-10-01 VITALS — BP 118/80 | Temp 98.6°F | Wt 233.0 lb

## 2012-10-01 DIAGNOSIS — M25559 Pain in unspecified hip: Secondary | ICD-10-CM

## 2012-10-01 DIAGNOSIS — M25551 Pain in right hip: Secondary | ICD-10-CM

## 2012-10-01 DIAGNOSIS — M545 Low back pain, unspecified: Secondary | ICD-10-CM

## 2012-10-01 DIAGNOSIS — E1142 Type 2 diabetes mellitus with diabetic polyneuropathy: Secondary | ICD-10-CM

## 2012-10-01 DIAGNOSIS — E1149 Type 2 diabetes mellitus with other diabetic neurological complication: Secondary | ICD-10-CM

## 2012-10-01 DIAGNOSIS — F329 Major depressive disorder, single episode, unspecified: Secondary | ICD-10-CM

## 2012-10-01 NOTE — Progress Notes (Signed)
Chief Complaint  Patient presents with  . right hip pain    HPI:  Acute visit for hip pain:  1) Hip Pain -R sided started after hiking 2-3 months ago and with exercising a lot lately - biking, walking -has tried heat and ice and topical -lateral R hip and radiates down lateral leg, sometimes in buttocks -denies: weakness, numbness, malaise, fevers, bowel or bladder incontinenence  2) Depression: -stable on celexa  3) HLD: -stable -lifestyle recs  )DM, uncontrolled: -followed by endocrinology, taking metformin bid, lantus 50 daily, novolog with meals -reviewed recent notes and changes -has follow up, had to reschedule -BS have been between 140-170  3)IBD: -followed by GI - getting colonoscopy -reviewed recent notes -advised pt of recs for pneumovax  - had pneumonia vaccine in November per her report  Health maintenance:  ROS: See pertinent positives and negatives per HPI.  Past Medical History  Diagnosis Date  . Diabetes mellitus without complication   . Hyperlipidemia   . Crohn disease   . Depression   . Chicken pox   . Depression   . Headache(784.0)     frequent  . Migraines     Family History  Problem Relation Age of Onset  . Uterine cancer Mother     uterin cancer  . Hyperlipidemia Mother   . Alcohol abuse Father   . Cancer Father     lung and brain  . Hyperlipidemia Father   . Heart disease Father   . Hypertension Father   . Drug abuse Maternal Grandmother   . Drug abuse Maternal Grandfather     History   Social History  . Marital Status: Married    Spouse Name: N/A    Number of Children: 2  . Years of Education: N/A   Occupational History  . Disabled    Social History Main Topics  . Smoking status: Never Smoker   . Smokeless tobacco: Never Used  . Alcohol Use: No  . Drug Use: No  . Sexually Active: None   Other Topics Concern  . None   Social History Narrative   Work or School: on permanent disability after multiple surgeries  for crohn's disease      Home Situation: lives with husband      Spiritual Beliefs:      Lifestyle: walks daily 10 minutes, working on improving diet                Current outpatient prescriptions:atorvastatin (LIPITOR) 40 MG tablet, Take 1 tablet by mouth daily., Disp: , Rfl: ;  buPROPion (WELLBUTRIN SR) 150 MG 12 hr tablet, Take 150 mg by mouth daily., Disp: , Rfl: ;  citalopram (CELEXA) 20 MG tablet, Take 1 tablet by mouth daily., Disp: , Rfl: ;  glucose blood (ACCU-CHEK AVIVA PLUS) test strip, Check 3 times daily., Disp: 100 each, Rfl: 5 Insulin Glargine (LANTUS SOLOSTAR) 100 UNIT/ML SOPN, Inject 50 Units into the skin at bedtime., Disp: 5 pen, Rfl: 11;  Lancets (ACCU-CHEK MULTICLIX) lancets, Test blood sugar 3 times daily., Disp: 100 each, Rfl: 5;  metFORMIN (GLUCOPHAGE) 1000 MG tablet, Take 1 tablet (1,000 mg total) by mouth 2 (two) times daily with a meal., Disp: 180 tablet, Rfl: 3;  NOVOFINE 32G X 6 MM MISC, 1 each by Other route daily., Disp: , Rfl:  NOVOLOG FLEXPEN 100 UNIT/ML injection, Sliding scale, Disp: , Rfl: ;  ondansetron (ZOFRAN ODT) 4 MG disintegrating tablet, Take 1 tablet (4 mg total) by mouth every 8 (eight) hours  as needed for nausea., Disp: 20 tablet, Rfl: 0;  pantoprazole (PROTONIX) 40 MG tablet, Take 1 tablet (40 mg total) by mouth daily., Disp: 90 tablet, Rfl: 3;  peg 3350 powder (MOVIPREP) 100 G SOLR, Take 1 kit (100 g total) by mouth once., Disp: 1 kit, Rfl: 0 traMADol (ULTRAM) 50 MG tablet, Take 1 tablet (50 mg total) by mouth every 6 (six) hours as needed for pain., Disp: 30 tablet, Rfl: 0  EXAM:  Filed Vitals:   10/01/12 1348  BP: 118/80  Temp: 98.6 F (37 C)    Body mass index is 35.93 kg/(m^2).  GENERAL: vitals reviewed and listed above, alert, oriented, appears well hydrated and in no acute distress  HEENT: atraumatic, conjunttiva clear, no obvious abnormalities on inspection of external nose and ears  NECK: no obvious masses on  inspection  LUNGS: clear to auscultation bilaterally, no wheezes, rales or rhonchi, good air movement  CV: HRRR, no peripheral edema  MS: moves all extremities without noticeable abnormality -normal gait -normal ROM, strength and sensation in LEs -TTP over greater trochanter and IT band even with light palpation, also in muscles of lumbar back -neg SLRT, CRLT, FABER, FADIR, facet loading, NV intact  PSYCH: pleasant and cooperative, no obvious depression or anxiety  ASSESSMENT AND PLAN:  Discussed the following assessment and plan:  Hip pain, right - Plan: DG Hip Complete Right, Ambulatory referral to Physical Therapy  Low back pain - Plan: DG Lumbar Spine Complete, Ambulatory referral to Physical Therapy  Depression  Uncontrolled type 2 diabetes mellitus with peripheral neuropathy  -suspect soft tissue origin for pain caused by recent increased exercise - will get plain films lumbar spine and hip, PT referral, pain control, return in 3-4 weeks and refer to specialist if not imporving -diabetes appears to be improving, she is to follow up with Dr. Lafe Garin, discussed BS goals, congratulated on lifestyle changes -other chronic issues stable other then undergoing eval with GI currently for her bowel issues and hx IBd -Patient advised to return or notify a doctor immediately if symptoms worsen or persist or new concerns arise.  Patient Instructions  -tylenol 500-1000mg  up to three times daily or ibuprofen 400mg  up to 2 times daily as needed for pain  -We have ordered xrays. It can take up to 1-2 weeks for results and processing. We will contact you with instructions IF your results are abnormal. Normal results will be released to your [facility]. If you have not heard from [place] or can not find your results in [facility] in 2 weeks please contact our office.  --We placed a referral for you as discussed. It usually takes about 1-2 weeks to process and schedule this referral. If you have not heard  from [place] regarding this appointment in 2 weeks please contact our office.   -follow up in 3-4 weeks            KIM, HANNAH R.

## 2012-10-01 NOTE — Patient Instructions (Signed)
-  tylenol 500-1000mg  up to three times daily or ibuprofen 400mg  up to 2 times daily as needed for pain  -We have ordered xrays. It can take up to 1-2 weeks for results and processing. We will contact you with instructions IF your results are abnormal. Normal results will be released to your [facility]. If you have not heard from [place] or can not find your results in [facility] in 2 weeks please contact our office.  --We placed a referral for you as discussed. It usually takes about 1-2 weeks to process and schedule this referral. If you have not heard from [place] regarding this appointment in 2 weeks please contact our office.   -follow up in 3-4 weeks

## 2012-10-01 NOTE — Progress Notes (Signed)
Quick Note:  Called and spoke with pt and pt is aware. ______ 

## 2012-10-04 ENCOUNTER — Ambulatory Visit: Payer: BC Managed Care – PPO

## 2012-10-11 ENCOUNTER — Encounter: Payer: Self-pay | Admitting: Internal Medicine

## 2012-10-11 ENCOUNTER — Ambulatory Visit (AMBULATORY_SURGERY_CENTER): Payer: BC Managed Care – PPO | Admitting: Internal Medicine

## 2012-10-11 VITALS — BP 116/75 | HR 79 | Temp 98.2°F | Resp 22 | Ht 67.5 in | Wt 229.0 lb

## 2012-10-11 DIAGNOSIS — R109 Unspecified abdominal pain: Secondary | ICD-10-CM

## 2012-10-11 DIAGNOSIS — K50919 Crohn's disease, unspecified, with unspecified complications: Secondary | ICD-10-CM

## 2012-10-11 DIAGNOSIS — K5120 Ulcerative (chronic) proctitis without complications: Secondary | ICD-10-CM

## 2012-10-11 DIAGNOSIS — D126 Benign neoplasm of colon, unspecified: Secondary | ICD-10-CM

## 2012-10-11 DIAGNOSIS — K5090 Crohn's disease, unspecified, without complications: Secondary | ICD-10-CM

## 2012-10-11 DIAGNOSIS — R197 Diarrhea, unspecified: Secondary | ICD-10-CM

## 2012-10-11 DIAGNOSIS — Z8719 Personal history of other diseases of the digestive system: Secondary | ICD-10-CM

## 2012-10-11 HISTORY — PX: COLONOSCOPY: SHX174

## 2012-10-11 MED ORDER — HYOSCYAMINE SULFATE 0.125 MG SL SUBL: 0.1250 mg | SUBLINGUAL_TABLET | SUBLINGUAL | Status: AC | PRN

## 2012-10-11 MED ORDER — TRAMADOL HCL 50 MG PO TABS: 50.0000 mg | ORAL_TABLET | Freq: Four times a day (QID) | ORAL | Status: DC | PRN

## 2012-10-11 MED ORDER — SODIUM CHLORIDE 0.9 % IV SOLN: 500.0000 mL | INTRAVENOUS | Status: DC

## 2012-10-11 NOTE — Progress Notes (Signed)
Patient did not experience any of the following events: a burn prior to discharge; a fall within the facility; wrong site/side/patient/procedure/implant event; or a hospital transfer or hospital admission upon discharge from the facility. (G8907) Patient did not have preoperative order for IV antibiotic SSI prophylaxis. (G8918)  

## 2012-10-11 NOTE — Patient Instructions (Addendum)
Discharge instructions given with verbal understanding. Handouts on polyps and hemorrhoids given. Resume previous medications. YOU HAD AN ENDOSCOPIC PROCEDURE TODAY AT THE [facility]: Refer to the procedure report that was given to you for any specific questions about what was found during the examination.  If the procedure report does not answer your questions, please call your gastroenterologist to clarify.  If you requested that your care partner not be given the details of your procedure findings, then the procedure report has been included in a sealed envelope for you to review at your convenience later.  YOU SHOULD EXPECT: Some feelings of bloating in the abdomen. Passage of more gas than usual.  Walking can help get rid of the air that was put into your GI tract during the procedure and reduce the bloating. If you had a lower endoscopy (such as a colonoscopy or flexible sigmoidoscopy) you may notice spotting of blood in your stool or on the toilet paper. If you underwent a bowel prep for your procedure, then you may not have a normal bowel movement for a few days.  DIET: Your first meal following the procedure should be a light meal and then it is ok to progress to your normal diet.  A half-sandwich or bowl of soup is an example of a good first meal.  Heavy or fried foods are harder to digest and may make you feel nauseous or bloated.  Likewise meals heavy in dairy and vegetables can cause extra gas to form and this can also increase the bloating.  Drink plenty of fluids but you should avoid alcoholic beverages for 24 hours.  ACTIVITY: Your care partner should take you home directly after the procedure.  You should plan to take it easy, moving slowly for the rest of the day.  You can resume normal activity the day after the procedure however you should NOT DRIVE or use heavy machinery for 24 hours (because of the sedation medicines used during the test).    SYMPTOMS TO REPORT  IMMEDIATELY: A gastroenterologist can be reached at any hour.  During normal business hours, 8:30 AM to 5:00 PM Monday through Friday, call [phone].  After hours and on weekends, please call the GI answering service at [phone] who will take a message and have the physician on call contact you.   Following lower endoscopy (colonoscopy or flexible sigmoidoscopy):  Excessive amounts of blood in the stool  Significant tenderness or worsening of abdominal pains  Swelling of the abdomen that is new, acute  Fever of 100F or higher FOLLOW UP: If any biopsies were taken you will be contacted by phone or by letter within the next 1-3 weeks.  Call your gastroenterologist if you have not heard about the biopsies in 3 weeks.  Our staff will call the home number listed on your records the next business day following your procedure to check on you and address any questions or concerns that you may have at that time regarding the information given to you following your procedure. This is a courtesy call and so if there is no answer at the home number and we have not heard from you through the emergency physician on call, we will assume that you have returned to your regular daily activities without incident.  SIGNATURES/CONFIDENTIALITY: You and/or your care partner have signed paperwork which will be entered into your electronic medical record.  These signatures attest to the fact that that the information above on your After Visit Summary   has been reviewed and is understood.  Full responsibility of the confidentiality of this discharge information lies with you and/or your care-partner.  

## 2012-10-11 NOTE — Progress Notes (Signed)
Called to room to assist during endoscopic procedure.  Patient ID and intended procedure confirmed with present staff. Received instructions for my participation in the procedure from the performing physician.  

## 2012-10-11 NOTE — Op Note (Signed)
[facility] [address] [place], [address]   COLONOSCOPY PROCEDURE REPORT  PATIENT: Ashley Velazquez, Ashley Velazquez  MR#: [id number] BIRTHDATE: 02/27/1961 , 51  yrs. old GENDER: Female ENDOSCOPIST: Beverley Fiedler, MD REFERRED BY: Kriste Basque, MD PROCEDURE DATE:  10/11/2012 PROCEDURE:   Colonoscopy with snare polypectomy and Colonoscopy with biopsy ASA CLASS:   Class III INDICATIONS:abdominal pain in the lower left quadrant, abdominal pain in the lower right quadrant, Chronic diarrhea, follow up for previously diagnosed Crohn's disease, and follow up for previously diagnosed proctitis ulcerative colitis. MEDICATIONS: MAC sedation, administered by CRNA and propofol (Diprivan) 350mg  IV  DESCRIPTION OF PROCEDURE:   After the risks benefits and alternatives of the procedure were thoroughly explained, informed consent was obtained.  A digital rectal exam revealed no rectal mass.   The LB [id number]  endoscope was introduced through the anus and advanced to the terminal ileum which was intubated for a short distance. No adverse events experienced.   The quality of the prep was good, using MoviPrep  The instrument was then slowly withdrawn as the colon was fully examined.    COLON FINDINGS: The mucosa appeared normal in the neo-terminal ileum.   There was evidence of a normal and healthy appearing prior surgical anastomosis in the ascending colon.   Two sessile polyps measuring 4-5 mm in size were found in the ascending colon and sigmoid colon.  Polypectomy was performed using cold snare.  All resections were complete and all polyp tissue was completely retrieved.   The colonic mucosa appeared normal throughout the entire examined colon with no evidence of active inflammatory bowel disease.  Multiple random biopsies of the area were performed. Retroflexed views revealed external hemorrhoids. The time to cecum=6 minutes 50 seconds.  Withdrawal time=17 minutes 00 seconds. The  scope was withdrawn and the procedure completed.  COMPLICATIONS: There were no complications.     ENDOSCOPIC IMPRESSION: 1.   Normal mucosa in the neo-terminal ileum 2.   There was evidence of prior healthy-appearing surgical anastomosis in the ascending colon 3.   Two sessile polyps measuring 4-5 mm in size were found in the ascending colon and sigmoid colon; Polypectomy was performed using cold snare 4.   The colonic mucosa appeared normal throughout the entire examined colon with no evidence of active inflammatory bowel disease; multiple random biopsies of the area were performed 5.   Small external hemorrhoids  RECOMMENDATIONS: 1.  Await pathology results 2.  Continue current medications 3.  Imodium or Lomotil can be used as needed and as directed for diarrhea 4.  Office followup in about 4 weeks   eSigned:  Beverley Fiedler, MD 10/11/2012 2:13 PM   cc: The Patient; Kriste Basque, MD   PATIENT NAME:  Ashley Velazquez, Ashley Velazquez MR#: [id number]

## 2012-10-11 NOTE — Progress Notes (Signed)
Lidocaine-40mg IV prior to Propofol InductionPropofol given over incremental dosages 

## 2012-10-12 ENCOUNTER — Telehealth: Payer: Self-pay

## 2012-10-12 NOTE — Telephone Encounter (Signed)
Left message on answering machine. 

## 2012-10-16 ENCOUNTER — Telehealth: Payer: Self-pay

## 2012-10-16 MED ORDER — CITALOPRAM HYDROBROMIDE 20 MG PO TABS: ORAL_TABLET | ORAL | Status: DC

## 2012-10-16 MED ORDER — BUPROPION HCL ER (SR) 150 MG PO TB12: 150.0000 mg | ORAL_TABLET | Freq: Every day | ORAL | Status: DC

## 2012-10-16 NOTE — Telephone Encounter (Signed)
Rx for citalopram 20 mg- take 1 to 1/2 tablets by mouth once daily.  Rx request for bupropion xl 150mg  -one tablet by mouth daily.  Pt last seen 10/01/12.  Pls advise.

## 2012-10-16 NOTE — Addendum Note (Signed)
Addended by: Azucena Freed on: 10/16/2012 04:35 PM   Modules accepted: Orders, Medications

## 2012-10-16 NOTE — Telephone Encounter (Signed)
Ok to refill x 6 months 

## 2012-10-17 ENCOUNTER — Ambulatory Visit: Payer: BC Managed Care – PPO | Attending: Family Medicine

## 2012-10-17 ENCOUNTER — Encounter: Payer: Self-pay | Admitting: Internal Medicine

## 2012-10-17 DIAGNOSIS — IMO0001 Reserved for inherently not codable concepts without codable children: Secondary | ICD-10-CM | POA: Insufficient documentation

## 2012-10-17 DIAGNOSIS — M545 Low back pain, unspecified: Secondary | ICD-10-CM | POA: Insufficient documentation

## 2012-10-17 DIAGNOSIS — R5381 Other malaise: Secondary | ICD-10-CM | POA: Insufficient documentation

## 2012-10-17 DIAGNOSIS — M25659 Stiffness of unspecified hip, not elsewhere classified: Secondary | ICD-10-CM | POA: Insufficient documentation

## 2012-10-17 DIAGNOSIS — M25559 Pain in unspecified hip: Secondary | ICD-10-CM | POA: Insufficient documentation

## 2012-10-18 ENCOUNTER — Telehealth: Payer: Self-pay | Admitting: Family Medicine

## 2012-10-18 NOTE — Telephone Encounter (Signed)
Please correct to what she was taking before. Thanks.

## 2012-10-18 NOTE — Telephone Encounter (Signed)
Pls advise which one is correct.

## 2012-10-18 NOTE — Telephone Encounter (Signed)
Pharmacy calling to confirm pt's refill of buPROPion (WELLBUTRIN SR) 150 MG 12 hr tablet. He stated that she has always taken the XR, but this time it was requested for SR. Is this correct? Thank you!

## 2012-10-19 MED ORDER — BUPROPION HCL ER (XL) 150 MG PO TB24: 150.0000 mg | ORAL_TABLET | Freq: Every day | ORAL | Status: DC

## 2012-10-19 NOTE — Telephone Encounter (Signed)
Rx changed and sent to pharmacy.  °

## 2012-10-19 NOTE — Addendum Note (Signed)
Addended by: Azucena Freed on: 10/19/2012 01:27 PM   Modules accepted: Orders, Medications

## 2012-10-22 ENCOUNTER — Other Ambulatory Visit: Payer: Self-pay | Admitting: *Deleted

## 2012-10-29 ENCOUNTER — Ambulatory Visit: Payer: BC Managed Care – PPO | Attending: Family Medicine | Admitting: Physical Therapy

## 2012-10-29 DIAGNOSIS — M25559 Pain in unspecified hip: Secondary | ICD-10-CM | POA: Insufficient documentation

## 2012-10-29 DIAGNOSIS — M545 Low back pain, unspecified: Secondary | ICD-10-CM | POA: Insufficient documentation

## 2012-10-29 DIAGNOSIS — M25659 Stiffness of unspecified hip, not elsewhere classified: Secondary | ICD-10-CM | POA: Insufficient documentation

## 2012-10-29 DIAGNOSIS — IMO0001 Reserved for inherently not codable concepts without codable children: Secondary | ICD-10-CM | POA: Insufficient documentation

## 2012-10-29 DIAGNOSIS — R5381 Other malaise: Secondary | ICD-10-CM | POA: Insufficient documentation

## 2012-11-02 ENCOUNTER — Ambulatory Visit: Payer: BC Managed Care – PPO | Admitting: Family Medicine

## 2012-11-05 ENCOUNTER — Ambulatory Visit: Payer: BC Managed Care – PPO

## 2012-11-06 ENCOUNTER — Encounter: Payer: Self-pay | Admitting: Internal Medicine

## 2012-11-12 ENCOUNTER — Encounter: Payer: Self-pay | Admitting: Family Medicine

## 2012-11-12 ENCOUNTER — Ambulatory Visit (INDEPENDENT_AMBULATORY_CARE_PROVIDER_SITE_OTHER): Payer: BC Managed Care – PPO | Admitting: Family Medicine

## 2012-11-12 ENCOUNTER — Ambulatory Visit: Payer: BC Managed Care – PPO | Admitting: Physical Therapy

## 2012-11-12 VITALS — BP 120/78 | Temp 98.3°F | Wt 233.0 lb

## 2012-11-12 DIAGNOSIS — IMO0002 Reserved for concepts with insufficient information to code with codable children: Secondary | ICD-10-CM

## 2012-11-12 DIAGNOSIS — M25559 Pain in unspecified hip: Secondary | ICD-10-CM

## 2012-11-12 DIAGNOSIS — M25551 Pain in right hip: Secondary | ICD-10-CM

## 2012-11-12 NOTE — Patient Instructions (Addendum)
-  We placed a referral for you as discussed for your hip pain. It usually takes about 1-2 weeks to process and schedule this referral. If you have not heard from [place] regarding this appointment in 2 weeks please contact our office.  -see Dr. Corky Sox for your diabetes  -follow up in 3 months or sooner if needed

## 2012-11-12 NOTE — Progress Notes (Addendum)
Chief Complaint  Patient presents with  . 1 month follow up    PT is not helping; pt is still in pain     HPI:  Follow up:  R Hip and chronic back pain: -obtained palin films last visit showing moderate lumbar DDD with facet arthropathy, normal hip and SI jts per radiology report -referred to PT and conservative tx -R hip pain started 2-3 months ago with exercise, now reports has gone to PT once per week with TENS, ice, stretching and [place] - does not feel like has gotten better -pain is R lateral hip and lateral leg and R buttock - a burning type of pain, worse with activity, nothing makes it better, feels like leg may give out at times -denies: fevers, weight loss, bowel or bladder incontinence  Depression: -takes wellbutrin and citalopram - a little irritable with the pain -no SI  DM: -followed by endocrinology -reports fasting BS improved in 120-140s -will be seeing Dr. Lafe Garin in follow up soon  GERD/?IBS: -followed by GI with recent extensive evaluations   ROS: See pertinent positives and negatives per HPI.  Past Medical History  Diagnosis Date  . Diabetes mellitus without complication   . Hyperlipidemia   . Crohn disease   . Depression   . Chicken pox   . Depression   . Headache(784.0)     frequent  . Migraines   . Colon polyps     Family History  Problem Relation Age of Onset  . Uterine cancer Mother     uterin cancer  . Hyperlipidemia Mother   . Alcohol abuse Father   . Cancer Father     lung and brain  . Hyperlipidemia Father   . Heart disease Father   . Hypertension Father   . Drug abuse Maternal Grandmother   . Drug abuse Maternal Grandfather     History   Social History  . Marital Status: Married    Spouse Name: N/A    Number of Children: 2  . Years of Education: N/A   Occupational History  . Disabled    Social History Main Topics  . Smoking status: Never Smoker   . Smokeless tobacco: Never Used  . Alcohol Use: No  . Drug Use: No   . Sexually Active: None   Other Topics Concern  . None   Social History Narrative   Work or School: on permanent disability after multiple surgeries for crohn's disease      Home Situation: lives with husband      Spiritual Beliefs:      Lifestyle: walks daily 10 minutes, working on improving diet                Current outpatient prescriptions:atorvastatin (LIPITOR) 40 MG tablet, Take 1 tablet by mouth daily., Disp: , Rfl: ;  buPROPion (WELLBUTRIN XL) 150 MG 24 hr tablet, Take 1 tablet (150 mg total) by mouth daily., Disp: 90 tablet, Rfl: 1;  citalopram (CELEXA) 20 MG tablet, Take 1 and 1/2 tablet by mouth once daily., Disp: 135 tablet, Rfl: 1;  glucose blood (ACCU-CHEK AVIVA PLUS) test strip, Check 3 times daily., Disp: 100 each, Rfl: 5 hyoscyamine (LEVSIN SL) 0.125 MG SL tablet, Place 1 tablet (0.125 mg total) under the tongue every 4 (four) hours as needed for cramping., Disp: 30 tablet, Rfl: 2;  Insulin Glargine (LANTUS SOLOSTAR) 100 UNIT/ML SOPN, Inject 50 Units into the skin at bedtime., Disp: 5 pen, Rfl: 11;  Lancets (ACCU-CHEK MULTICLIX) lancets, Test blood  sugar 3 times daily., Disp: 100 each, Rfl: 5 metFORMIN (GLUCOPHAGE) 1000 MG tablet, Take 1 tablet (1,000 mg total) by mouth 2 (two) times daily with a meal., Disp: 180 tablet, Rfl: 3;  MOVIPREP 100 G SOLR, , Disp: , Rfl: ;  NOVOFINE 32G X 6 MM MISC, 1 each by Other route daily., Disp: , Rfl: ;  NOVOLOG FLEXPEN 100 UNIT/ML injection, Sliding scale, Disp: , Rfl:  ondansetron (ZOFRAN ODT) 4 MG disintegrating tablet, Take 1 tablet (4 mg total) by mouth every 8 (eight) hours as needed for nausea., Disp: 20 tablet, Rfl: 0;  pantoprazole (PROTONIX) 40 MG tablet, Take 1 tablet (40 mg total) by mouth daily., Disp: 90 tablet, Rfl: 3;  traMADol (ULTRAM) 50 MG tablet, Take 1 tablet (50 mg total) by mouth every 6 (six) hours as needed for pain., Disp: 30 tablet, Rfl: 2  EXAM:  Filed Vitals:   11/12/12 1023  BP: 120/78  Temp: 98.3 F  (36.8 C)    Body mass index is 35.93 kg/(m^2).  GENERAL: vitals reviewed and listed above, alert, oriented, appears well hydrated and in no acute distress  HEENT: atraumatic, conjunttiva clear, no obvious abnormalities on inspection of external nose and ears  NECK: no obvious masses on inspection  LUNGS: clear to auscultation bilaterally, no wheezes, rales or rhonchi, good air movement  CV: HRRR, no peripheral edema  MS: moves all extremities without noticeable abnormality Normal Gait Normal inspection of back, no obvious scoliosis or leg length descrepancy No bony TTP Soft tissue TTP at: TTP R piriformis and lateral upper thigh (IT band area) -/+ tests: neg trendelenburg,+facet loading lumber R, -SLRT cause pain in R lateral hip, -CLRT, +FABER, + R FADIR Normal muscle strength, sensation to light touch and DTRs in LEs bilaterally - reports possible mild decreased sensation R lateral thigh  PSYCH: pleasant and cooperative, no obvious depression or anxiety  ASSESSMENT AND PLAN:  Discussed the following assessment and plan:  Hip pain, acute, right - Plan: Ambulatory referral to Physical Medicine Rehab  DDD (degenerative disc disease) - Plan: Ambulatory referral to Physical Medicine Rehab  -Character of pain is more nerve like today - query meralgia paresthetica versus radicular, and is worsening despite physical therapy - will have her see PMR to assist - in meantime will hold on PT and advised gentle regular activity, getting BS under control and pain control with tramadol, tylenol and topical products -discuss proper doses and uses and risks -follow up in 3 months -Patient advised to return or notify a doctor immediately if symptoms worsen or persist or new concerns arise.  Patient Instructions  -We placed a referral for you as discussed for your hip pain. It usually takes about 1-2 weeks to process and schedule this referral. If you have not heard from [place] regarding this  appointment in 2 weeks please contact our office.  -see Dr. Corky Sox for your diabetes  -follow up in 3 months or sooner if needed       Pride Gonzales, [facility] R.

## 2012-11-13 ENCOUNTER — Ambulatory Visit: Payer: BC Managed Care – PPO | Admitting: Internal Medicine

## 2012-11-16 ENCOUNTER — Ambulatory Visit: Payer: BC Managed Care – PPO | Admitting: Family Medicine

## 2012-11-19 ENCOUNTER — Encounter: Payer: BC Managed Care – PPO | Admitting: Physical Therapy

## 2012-11-21 ENCOUNTER — Encounter: Payer: Self-pay | Admitting: Internal Medicine

## 2012-11-23 ENCOUNTER — Ambulatory Visit: Payer: BC Managed Care – PPO | Admitting: Internal Medicine

## 2012-11-26 ENCOUNTER — Encounter: Payer: BC Managed Care – PPO | Admitting: Physical Therapy

## 2012-11-28 ENCOUNTER — Encounter: Payer: Self-pay | Admitting: Physical Medicine & Rehabilitation

## 2012-12-12 ENCOUNTER — Telehealth: Payer: Self-pay | Admitting: Family Medicine

## 2012-12-12 NOTE — Telephone Encounter (Signed)
Patient Information:  Caller Name: Aubriel  Phone: [phone]  Patient: Ashley Velazquez, Ashley Velazquez  Gender: Female  DOB: 11/21/1960  Age: 52 Years  PCP: Kriste Basque [facility])  Pregnant: No  Office Follow Up:  Does the office need to follow up with this patient?: Yes  Instructions For The Office: Please call back regarding increasing depression and, if approved, an appointment with Dr Selena Batten for 12/14/12.  RN Note:  Hysterectomy. Lives with husband but home alone all day.  No longer looks forward to husband coming home in the evening.  Not in counseling. At 1400, pt still in PJ's; often wont dress until 1730. Sleeps at intervals during the day.  Feels depression is worsening.  Taking two antidepressants currently.  Editor, commissioning; suggested she use internet to look up participating psychologists for counseling.  Unable to come to office due to transportation until 12/14/12 and prepared to wait until then to be seen. Denies suicidal ideation. Informed Dr Selena Batten is out of the office.  Office staff will discuss with MD on call and call back for appointment 12/14/12 with Dr Selena Batten if approved by MD.  Symptoms  Reason For Call & Symptoms: Worsening depression; crying "over nothing," not sleeping, does not want to dress or shower, or be with people. FBS 120-130.  Reviewed Health History In EMR: Yes  Reviewed Medications In EMR: Yes  Reviewed Allergies In EMR: Yes  Reviewed Surgeries / Procedures: No  Date of Onset of Symptoms: 11/28/2012  Treatments Tried: Wellbutrin, Citalopram  Treatments Tried Worked: No OB / GYN:  LMP: Unknown  Guideline(s) Used:  Depression  Disposition Per Guideline:   Call Local Agency Now  Reason For Disposition Reached:   Patient sounds very upset or severely depressed (e.g., multiple symptoms of depression)  Advice Given:  Reassurance:   People with depression do get through this -- even people who feel as badly as you feel now. You can be helped.  Encourage the  caller to talk about his/her problems and feelings.  Offer hope.  Suggestions for Healthy Living  Communicate: Share how you are feeling with someone in your life who is a good listener. Make certain that your spouse, family, or friends know how you are feeling.  Exercise regularly: Take a daily walk.  Avoid alcohol.  RN Overrode Recommendation:  Follow Up With Office Later  Information sent to office for MD on call

## 2012-12-12 NOTE — Telephone Encounter (Signed)
Please call pt to schedule appt to se Dr. Selena Batten

## 2012-12-12 NOTE — Telephone Encounter (Signed)
Scheduled

## 2012-12-13 ENCOUNTER — Encounter: Payer: Self-pay | Admitting: Family Medicine

## 2012-12-13 ENCOUNTER — Ambulatory Visit (INDEPENDENT_AMBULATORY_CARE_PROVIDER_SITE_OTHER): Payer: BC Managed Care – PPO | Admitting: Family Medicine

## 2012-12-13 VITALS — BP 126/86 | Temp 98.6°F | Wt 228.0 lb

## 2012-12-13 DIAGNOSIS — F3289 Other specified depressive episodes: Secondary | ICD-10-CM

## 2012-12-13 DIAGNOSIS — F329 Major depressive disorder, single episode, unspecified: Secondary | ICD-10-CM

## 2012-12-13 NOTE — Patient Instructions (Signed)
-  schedule appointment with psychiatrist and counselor  -exercise  -increase celexa back to 1.5 tabs daily (30mg ) until see psychiatrist  -seek care immediately if any thoughts of hurting yourself

## 2012-12-13 NOTE — Progress Notes (Signed)
Chief Complaint  Patient presents with  . Depression    HPI:  Acute visit for Worsening Depression: -on two : celexa 20mg  daily and wellbutrin 150xr daily -depression worsening over last 2 weeks -depressed mood despite, no motivation, cries frequently, not interested in seeing spouse and irritable -not getting counseling, no regular exercise -no SI or thoughts of self harm; guns in the home - but she does not know where they are and bullets separate and she has not interest in SI or self harm or harm to others -prior psych hx:  No hx of hospitalization -denies any hx of or current manic symptoms -kids are very supportive, coming to stay with her - they understand better then her husband about depression  ROS: See pertinent positives and negatives per HPI.  Past Medical History  Diagnosis Date  . Diabetes mellitus without complication   . Hyperlipidemia   . Crohn disease   . Depression   . Chicken pox   . Depression   . Headache(784.0)     frequent  . Migraines   . Colon polyps     Family History  Problem Relation Age of Onset  . Uterine cancer Mother     uterin cancer  . Hyperlipidemia Mother   . Alcohol abuse Father   . Cancer Father     lung and brain  . Hyperlipidemia Father   . Heart disease Father   . Hypertension Father   . Drug abuse Maternal Grandmother   . Drug abuse Maternal Grandfather     History   Social History  . Marital Status: Married    Spouse Name: N/A    Number of Children: 2  . Years of Education: N/A   Occupational History  . Disabled    Social History Main Topics  . Smoking status: Never Smoker   . Smokeless tobacco: Never Used  . Alcohol Use: No  . Drug Use: No  . Sexual Activity: None   Other Topics Concern  . None   Social History Narrative   Work or School: on permanent disability after multiple surgeries for crohn's disease      Home Situation: lives with husband      Spiritual Beliefs:      Lifestyle: walks daily  10 minutes, working on improving diet                Current outpatient prescriptions:atorvastatin (LIPITOR) 40 MG tablet, Take 1 tablet by mouth daily., Disp: , Rfl: ;  buPROPion (WELLBUTRIN XL) 150 MG 24 hr tablet, Take 1 tablet (150 mg total) by mouth daily., Disp: 90 tablet, Rfl: 1;  citalopram (CELEXA) 20 MG tablet, Take 1 and 1/2 tablet by mouth once daily., Disp: 135 tablet, Rfl: 1;  glucose blood (ACCU-CHEK AVIVA PLUS) test strip, Check 3 times daily., Disp: 100 each, Rfl: 5 hyoscyamine (LEVSIN SL) 0.125 MG SL tablet, Place 1 tablet (0.125 mg total) under the tongue every 4 (four) hours as needed for cramping., Disp: 30 tablet, Rfl: 2;  Insulin Glargine (LANTUS SOLOSTAR) 100 UNIT/ML SOPN, Inject 50 Units into the skin at bedtime., Disp: 5 pen, Rfl: 11;  Lancets (ACCU-CHEK MULTICLIX) lancets, Test blood sugar 3 times daily., Disp: 100 each, Rfl: 5 metFORMIN (GLUCOPHAGE) 1000 MG tablet, Take 1 tablet (1,000 mg total) by mouth 2 (two) times daily with a meal., Disp: 180 tablet, Rfl: 3;  MOVIPREP 100 G SOLR, , Disp: , Rfl: ;  NOVOFINE 32G X 6 MM MISC, 1 each by Other route  daily., Disp: , Rfl: ;  NOVOLOG FLEXPEN 100 UNIT/ML injection, Sliding scale, Disp: , Rfl:  ondansetron (ZOFRAN ODT) 4 MG disintegrating tablet, Take 1 tablet (4 mg total) by mouth every 8 (eight) hours as needed for nausea., Disp: 20 tablet, Rfl: 0;  pantoprazole (PROTONIX) 40 MG tablet, Take 1 tablet (40 mg total) by mouth daily., Disp: 90 tablet, Rfl: 3;  traMADol (ULTRAM) 50 MG tablet, Take 1 tablet (50 mg total) by mouth every 6 (six) hours as needed for pain., Disp: 30 tablet, Rfl: 2  EXAM:  Filed Vitals:   12/13/12 1021  BP: 126/86  Temp: 98.6 F (37 C)    Body mass index is 35.16 kg/(m^2).  GENERAL: vitals reviewed and listed above, alert, oriented, appears well hydrated and in no acute distress  HEENT: atraumatic, conjunttiva clear, no obvious abnormalities on inspection of external nose and ears  NECK: no  obvious masses on inspection  MS: moves all extremities without noticeable abnormality  PSYCH: pleasant and cooperative, depressed mood  ASSESSMENT AND PLAN:  Discussed the following assessment and plan:  Depression  -25 minutes spent face to face counseling this patient -discussed options and given already on 2 antidepressants with worsening depressive symptoms advised she see a psychiatrist and counselor -advised regular exercise and discussed pool may be the best given her hip issues (she is seeing Dr. Wynn Banker for this next week) -will increase celexa back to 1.5 tabs daily which is what she used to take - discussed risks -return precautions, ED precuations -Patient advised to return or notify a doctor immediately if symptoms worsen or persist or new concerns arise.  Patient Instructions  -schedule appointment with psychiatrist and counselor  -exercise  -increase celexa back to 1.5 tabs daily (30mg ) until see psychiatrist  -seek care immediately if any thoughts of hurting yourself     Ashley Basque R.

## 2012-12-28 ENCOUNTER — Ambulatory Visit: Payer: BC Managed Care – PPO | Admitting: Physical Medicine & Rehabilitation

## 2013-01-16 ENCOUNTER — Other Ambulatory Visit: Payer: Self-pay | Admitting: Internal Medicine

## 2013-01-18 ENCOUNTER — Ambulatory Visit: Payer: BC Managed Care – PPO | Admitting: Physical Medicine & Rehabilitation

## 2013-01-25 ENCOUNTER — Ambulatory Visit: Payer: BC Managed Care – PPO | Admitting: Family Medicine

## 2013-02-08 ENCOUNTER — Encounter: Payer: Self-pay | Admitting: Physical Medicine & Rehabilitation

## 2013-02-08 ENCOUNTER — Ambulatory Visit (HOSPITAL_BASED_OUTPATIENT_CLINIC_OR_DEPARTMENT_OTHER): Payer: BC Managed Care – PPO | Admitting: Physical Medicine & Rehabilitation

## 2013-02-08 ENCOUNTER — Encounter: Payer: BC Managed Care – PPO | Attending: Physical Medicine & Rehabilitation

## 2013-02-08 VITALS — BP 97/67 | HR 67 | Resp 14 | Ht 67.0 in | Wt 231.0 lb

## 2013-02-08 DIAGNOSIS — Z5181 Encounter for therapeutic drug level monitoring: Secondary | ICD-10-CM

## 2013-02-08 DIAGNOSIS — Z79899 Other long term (current) drug therapy: Secondary | ICD-10-CM

## 2013-02-08 DIAGNOSIS — M76899 Other specified enthesopathies of unspecified lower limb, excluding foot: Secondary | ICD-10-CM

## 2013-02-08 DIAGNOSIS — M25559 Pain in unspecified hip: Secondary | ICD-10-CM

## 2013-02-08 DIAGNOSIS — M47817 Spondylosis without myelopathy or radiculopathy, lumbosacral region: Secondary | ICD-10-CM | POA: Insufficient documentation

## 2013-02-08 DIAGNOSIS — M7061 Trochanteric bursitis, right hip: Secondary | ICD-10-CM

## 2013-02-08 NOTE — Progress Notes (Signed)
Subjective:    Patient ID: Ashley Velazquez, female    DOB: [birth year], 52 y.o.   MRN: [id number]  HPI Pain in back and R hip.  Onset in Feb 2014 On and off pain since last year MD in [place] rec acetominophen and hot packs Current exacerbation after hiking in mountains, uphill and downhill equally pain ful  PT ordered by PCP in August, bike on second visit caused increased pain Acetominophen 3 times a day PMH:  Crohn's disease but was told she is in remission by GI in June 2014.  Had true flareup about 1 yr ago, DM Pain Inventory Average Pain 6 Pain Right Now 5 My pain is constant, sharp, burning and tingling  In the last 24 hours, has pain interfered with the following? General activity 3 Relation with others 0 Enjoyment of life 6 What TIME of day is your pain at its worst? all Sleep (in general) Poor  Pain is worse with: walking, bending, sitting, inactivity and standing Pain improves with: therapy/exercise Relief from Meds: 2  Mobility walk without assistance how many minutes can you walk? 10 ability to climb steps?  yes do you drive?  yes  Function disabled: date disabled 2005  Neuro/Psych tingling trouble walking depression anxiety loss of taste or smell  Prior Studies Any changes since last visit?  no  Physicians involved in your care Any changes since last visit?  no   Family History  Problem Relation Age of Onset  . Uterine cancer Mother     uterin cancer  . Hyperlipidemia Mother   . Alcohol abuse Father   . Cancer Father     lung and brain  . Hyperlipidemia Father   . Heart disease Father   . Hypertension Father   . Drug abuse Maternal Grandmother   . Drug abuse Maternal Grandfather    History   Social History  . Marital Status: Married    Spouse Name: N/A    Number of Children: 2  . Years of Education: N/A   Occupational History  . Disabled    Social History Main Topics  . Smoking status: Never Smoker   . Smokeless tobacco: Never  Used  . Alcohol Use: No  . Drug Use: No  . Sexual Activity: None   Other Topics Concern  . None   Social History Narrative   Work or School: on permanent disability after multiple surgeries for crohn's disease      Home Situation: lives with husband      Spiritual Beliefs:      Lifestyle: walks daily 10 minutes, working on improving diet               Past Surgical History  Procedure Laterality Date  . Abdominal hysterectomy  1997  . Colon surgery  2006    partial colectomy  . Appendectomy  1997  . Tonsillectomy and adenoidectomy    . Cesarean section  1993, 1995  . Hernia repair  2006/2008    x2  . Colonoscopy  10/11/2012    Dr. Rhea Belton   Past Medical History  Diagnosis Date  . Diabetes mellitus without complication   . Hyperlipidemia   . Crohn disease   . Depression   . Chicken pox   . Depression   . Headache(784.0)     frequent  . Migraines   . Colon polyps    BP 97/67  Pulse 67  Resp 14  Ht 5\' 7"  (1.702 m)  Wt 231 lb (104.781 kg)  BMI 36.17 kg/m2  SpO2 97%     Review of Systems  Constitutional: Positive for diaphoresis.  Endocrine:       High blood sugars  Musculoskeletal: Positive for back pain and gait problem.       Hip pain  Neurological:       Tingling  Psychiatric/Behavioral: Positive for dysphoric mood. The patient is nervous/anxious.   All other systems reviewed and are negative.       Objective:   Physical Exam  Nursing note and vitals reviewed. Constitutional: She is oriented to person, place, and time. She appears well-developed and well-nourished.  HENT:  Head: Normocephalic and atraumatic.  Eyes: Conjunctivae and EOM are normal. Pupils are equal, round, and reactive to light.  Neurological: She is alert and oriented to person, place, and time.  Psychiatric: She has a normal mood and affect.   Patient right foot reduced to pinprick, nondermatomal  Positive fabers maneuver right side  Negative straight leg raise  test  Tenderness over the right greater trochanter of the femur    Assessment & Plan:  1. Low back and right hip pain appears to be multifactorial: A. Right trochanteric bursitis with tensor fascia lata syndrome, Will inject today B. Probable right sacroiliac dysfunction. Has history of inflammatory bowel disease which can be associated with sacroiliitis. May benefit from fluoroscopic SI injection C. Lumbar spondylosis with or without lumbar degenerative disc, x-rays do show decreased L5-S1 disc space  Trochanteric bursa injection without ultrasound guidance  Indication Trochanteric bursitis. Exam has tenderness over the greater trochanter of the hip. Pain has not responded to conservative care such as exercise therapy and oral medications. Pain interferes with sleep or with mobility Informed consent was obtained after describing risks and benefits of the procedure with the patient these include bleeding bruising and infection. Patient has signed written consent form. Patient placed in a lateral decubitus position with the affected hip superior. Point of maximal pain was palpated marked and prepped with Betadine and entered with a needle to bone contact. Needle slightly withdrawn then 6mg  of betamethasone with 4 cc 1% lidocaine were injected. Patient tolerated procedure well. Post procedure instructions given. RTC 3 weeks

## 2013-02-08 NOTE — Patient Instructions (Signed)

## 2013-02-28 ENCOUNTER — Other Ambulatory Visit: Payer: Self-pay

## 2013-03-15 ENCOUNTER — Encounter: Payer: Self-pay | Admitting: Physical Medicine & Rehabilitation

## 2013-03-15 ENCOUNTER — Ambulatory Visit (HOSPITAL_BASED_OUTPATIENT_CLINIC_OR_DEPARTMENT_OTHER): Payer: BC Managed Care – PPO | Admitting: Physical Medicine & Rehabilitation

## 2013-03-15 ENCOUNTER — Other Ambulatory Visit: Payer: Self-pay

## 2013-03-15 ENCOUNTER — Encounter: Payer: BC Managed Care – PPO | Attending: Physical Medicine & Rehabilitation

## 2013-03-15 VITALS — BP 128/78 | HR 104 | Resp 14 | Ht 61.0 in | Wt 233.0 lb

## 2013-03-15 DIAGNOSIS — M6289 Other specified disorders of muscle: Secondary | ICD-10-CM | POA: Insufficient documentation

## 2013-03-15 DIAGNOSIS — M461 Sacroiliitis, not elsewhere classified: Secondary | ICD-10-CM | POA: Insufficient documentation

## 2013-03-15 DIAGNOSIS — M47817 Spondylosis without myelopathy or radiculopathy, lumbosacral region: Secondary | ICD-10-CM | POA: Insufficient documentation

## 2013-03-15 DIAGNOSIS — M6580 Other synovitis and tenosynovitis, unspecified site: Secondary | ICD-10-CM

## 2013-03-15 DIAGNOSIS — M5137 Other intervertebral disc degeneration, lumbosacral region: Secondary | ICD-10-CM | POA: Insufficient documentation

## 2013-03-15 DIAGNOSIS — M76899 Other specified enthesopathies of unspecified lower limb, excluding foot: Secondary | ICD-10-CM | POA: Insufficient documentation

## 2013-03-15 DIAGNOSIS — M51379 Other intervertebral disc degeneration, lumbosacral region without mention of lumbar back pain or lower extremity pain: Secondary | ICD-10-CM | POA: Insufficient documentation

## 2013-03-15 MED ORDER — TRAMADOL HCL 50 MG PO TABS: 50.0000 mg | ORAL_TABLET | Freq: Every day | ORAL | Status: DC

## 2013-03-15 NOTE — Progress Notes (Signed)
Subjective:    Patient ID: Ashley Velazquez, female    DOB: [birth year], 52 y.o.   MRN: [id number]  HPI Pain in back and R hip. Onset in Feb 2014  On and off pain since last year  MD in [place] rec acetominophen and hot packs  Current exacerbation after hiking in mountains, uphill and downhill equally pain ful  PT ordered by PCP in August, bike on second visit caused increased pain  Acetominophen 3 times a day  PMH: Crohn's disease but was told she is in remission by GI in June 2014. Had true flareup about 1 yr ago, DM  Right hip injection help for about one week and then wore off. Not able to sleep on the right side.  Also complaining as a primary complaint right back and buttocks pain that radiates to the back of the thigh toward the knee  Low back pain is  Moderate, pain is exacerbated by squatting or sitting on floor  Biking exacerbates pain, walking for causes  Worsened hip pain on right Pain Inventory Average Pain 8 Pain Right Now 8 My pain is constant  In the last 24 hours, has pain interfered with the following? General activity 3 Relation with others 0 Enjoyment of life 3 What TIME of day is your pain at its worst? constant Sleep (in general) Poor  Pain is worse with: walking, bending, sitting and some activites Pain improves with: medication Relief from Meds: 1  Mobility walk without assistance how many minutes can you walk? 10 ability to climb steps?  yes do you drive?  yes  Function disabled: date disabled 06/2004  Neuro/Psych spasms depression anxiety  Prior Studies Any changes since last visit?  no  Physicians involved in your care Any changes since last visit?  no   Family History  Problem Relation Age of Onset  . Uterine cancer Mother     uterin cancer  . Hyperlipidemia Mother   . Alcohol abuse Father   . Cancer Father     lung and brain  . Hyperlipidemia Father   . Heart disease Father   . Hypertension Father   . Drug abuse  Maternal Grandmother   . Drug abuse Maternal Grandfather    History   Social History  . Marital Status: Married    Spouse Name: N/A    Number of Children: 2  . Years of Education: N/A   Occupational History  . Disabled    Social History Main Topics  . Smoking status: Never Smoker   . Smokeless tobacco: Never Used  . Alcohol Use: No  . Drug Use: No  . Sexual Activity: None   Other Topics Concern  . None   Social History Narrative   Work or School: on permanent disability after multiple surgeries for crohn's disease      Home Situation: lives with husband      Spiritual Beliefs:      Lifestyle: walks daily 10 minutes, working on improving diet               Past Surgical History  Procedure Laterality Date  . Abdominal hysterectomy  1997  . Colon surgery  2006    partial colectomy  . Appendectomy  1997  . Tonsillectomy and adenoidectomy    . Cesarean section  1993, 1995  . Hernia repair  2006/2008    x2  . Colonoscopy  10/11/2012    Dr. Rhea Belton   Past Medical History  Diagnosis Date  . Diabetes  mellitus without complication   . Hyperlipidemia   . Crohn disease   . Depression   . Chicken pox   . Depression   . Headache(784.0)     frequent  . Migraines   . Colon polyps    BP 128/78  Pulse 104  Resp 14  Ht 5\' 1"  (1.549 m)  Wt 233 lb (105.688 kg)  BMI 44.05 kg/m2  SpO2 99%     Review of Systems  Constitutional: Positive for fever, chills and unexpected weight change.  Musculoskeletal: Positive for back pain.  Psychiatric/Behavioral: Positive for dysphoric mood. The patient is nervous/anxious.   All other systems reviewed and are negative.       Objective:   Physical Exam  Nursing note and vitals reviewed.  Constitutional: She is oriented to person, place, and time. She appears well-developed and well-nourished.  HENT:  Head: Normocephalic and atraumatic.  Eyes: Conjunctivae and EOM are normal. Pupils are equal, round, and reactive to  light.  Neurological: She is alert and oriented to person, place, and time.  Psychiatric: She has a normal mood and affect.   Positive fabers maneuver right side >> Left side with pain at the PSIS area Negative straight leg raise test  Tenderness over the right greater trochanter of the femur Motor strength is 5/5 bilateral hip flexors knee extensors ankle dorsiflexors and plantar flexors      Assessment & Plan:  1. Low back and right hip pain appears to be multifactorial:  A. 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 Probable right sacroiliac dysfunction. Has history of inflammatory bowel disease which can be associated with sacroiliitis. Scheduled for fluoroscopic SI injection  C. Lumbar spondylosis with or without lumbar degenerative disc, x-rays do show decreased L5-S1 disc space, no signs of radiculopathy. I do not think this is a major complaints at this point.

## 2013-03-15 NOTE — Patient Instructions (Signed)

## 2013-04-16 ENCOUNTER — Other Ambulatory Visit: Payer: Self-pay | Admitting: Family Medicine

## 2013-04-16 MED ORDER — INSULIN ASPART 100 UNIT/ML FLEXPEN: PEN_INJECTOR | SUBCUTANEOUS | Status: DC

## 2013-04-16 NOTE — Addendum Note (Signed)
Addended by: Azucena Freed on: 04/16/2013 03:17 PM   Modules accepted: Orders

## 2013-05-02 ENCOUNTER — Ambulatory Visit (HOSPITAL_BASED_OUTPATIENT_CLINIC_OR_DEPARTMENT_OTHER): Payer: BC Managed Care – PPO | Admitting: Physical Medicine & Rehabilitation

## 2013-05-02 ENCOUNTER — Encounter: Payer: BC Managed Care – PPO | Attending: Physical Medicine & Rehabilitation

## 2013-05-02 ENCOUNTER — Encounter: Payer: Self-pay | Admitting: Physical Medicine & Rehabilitation

## 2013-05-02 VITALS — BP 136/80 | HR 93 | Resp 14 | Ht 61.0 in | Wt 234.0 lb

## 2013-05-02 DIAGNOSIS — M76899 Other specified enthesopathies of unspecified lower limb, excluding foot: Secondary | ICD-10-CM | POA: Insufficient documentation

## 2013-05-02 DIAGNOSIS — M461 Sacroiliitis, not elsewhere classified: Secondary | ICD-10-CM

## 2013-05-02 DIAGNOSIS — M47817 Spondylosis without myelopathy or radiculopathy, lumbosacral region: Secondary | ICD-10-CM | POA: Insufficient documentation

## 2013-05-02 MED ORDER — TRAMADOL HCL 50 MG PO TABS: 50.0000 mg | ORAL_TABLET | Freq: Every day | ORAL | Status: DC

## 2013-05-02 NOTE — Progress Notes (Signed)

## 2013-05-02 NOTE — Patient Instructions (Signed)
Sacroiliac injection right side [place] Lidocaine Omnipaque  Please note her pain response. Please rate the percent improvement if possible. Our goal is at least 50% relief of pain in the buttocks hip and back area  Duration of response varies it may improve 4 hours days weeks or months

## 2013-05-02 NOTE — Progress Notes (Signed)
  PROCEDURE RECORD [facility]   Name: Christiane Sistare DOB:1960/08/04 MRN: [id number]  Date:05/02/2013  Physician: Alysia Penna, MD    Nurse/CMA: Vonna Drafts, CMA/[place],CMA  Allergies:  Allergies  Allergen Reactions  . Cymbalta [Duloxetine Hcl] Other (See Comments)    tachycardia    Consent Signed: yes  Is patient diabetic? yes  CBG today? 132  Pregnant: no LMP: No LMP recorded. Patient has had a hysterectomy. (age 52-55)  Anticoagulants: no Anti-inflammatory: no Antibiotics: no  Procedure: sacroiliac injection  Position: Prone Start Time: 11:09 am End Time: 11:12 am  Fluoro Time: 16 seconds  RN/CMA Khadir Roam,CMA Walston,CMA    Time 1019 11:16    BP 136/80 133/68    Pulse 93 86    Respirations 14 14    O2 Sat 98 99    S/S 6 6    Pain Level 7 0     D/C home with Josph Macho , patient A & O X 3, D/C instructions reviewed, and sits independently.

## 2013-05-22 ENCOUNTER — Other Ambulatory Visit: Payer: Self-pay | Admitting: Family Medicine

## 2013-05-27 ENCOUNTER — Telehealth: Payer: Self-pay | Admitting: Family Medicine

## 2013-05-27 DIAGNOSIS — IMO0002 Reserved for concepts with insufficient information to code with codable children: Secondary | ICD-10-CM

## 2013-05-27 DIAGNOSIS — E1142 Type 2 diabetes mellitus with diabetic polyneuropathy: Secondary | ICD-10-CM

## 2013-05-27 DIAGNOSIS — E1165 Type 2 diabetes mellitus with hyperglycemia: Principal | ICD-10-CM

## 2013-05-27 NOTE — Telephone Encounter (Signed)
Noted  

## 2013-05-27 NOTE — Telephone Encounter (Signed)
Patient Information:  Caller Name: [name]  Phone: [phone]  Patient: Ashley Velazquez, Ashley Velazquez  Gender: Female  DOB: [birth year]  Age: 53 Years  PCP: Ashley Velazquez (TEXT 1st, after 20 mins can call), Ashley Velazquez [facility])  Pregnant: No  Office Follow Up:  Does the office need to follow up with this patient?: No  Instructions For The Office: N/A   Symptoms  Reason For Call & Symptoms: Started with sore throat on 05/23/13 and she was feeling nauseous,achy, fatigued and had dry cough. Today her throat is burning up into nose and hurts to drink. She is taking NyQuill to help sleep. She has occasional dry cough. Hx of DM- BG up to 150. She is congested and  has thick yellow nasal drainage. Ear congestion and pain, headaches, and pressure behind R eye. Fever > 3 days.   Reviewed Health History In EMR: Yes  Reviewed Medications In EMR: Yes  Reviewed Allergies In EMR: Yes  Reviewed Surgeries / Procedures: Yes  Date of Onset of Symptoms: 05/23/2013  Treatments Tried: Nyquill  Treatments Tried Worked: No  Any Fever: Yes  Fever Taken: Oral  Fever Time Of Reading: 10:30:00  Fever Last Reading: 101.1 OB / GYN:  LMP: Unknown  Guideline(s) Used:  Influenza - Seasonal  Disposition Per Guideline:   See Today in Office  Reason For Disposition Reached:   Earache  Advice Given:  Reassurance  For most healthy adults, influenza feels like a bad cold. The dangers of influenza for normal, healthy people (under 88 years of age) are overrated.  The treatment of influenza depends on your main symptoms. Generally, treatment is the same as for other viral respiratory infections (colds). Bed rest is unnecessary.  Here is some care advice that should help.  Treating the Symptoms of Flu  Fever, Muscle Aches, and Headache: For fever more than 101 F (38.3 C), muscle aches, and headaches, take acetaminophen every 4-6 hours (Adults 650 mg) OR ibuprofen every 6-8 hours (Adults 400-600 mg).  Sore Throat: Use throat  lozenges, hard candy or warm chicken broth.  Cough: Use cough drops.  Hydrate: Drink extra liquids. If the air in your home is dry, use a humidifier.  Expected Course  : The fever lasts 2-3 days, the runny nose 5-10 days, and the cough 2-3 weeks.  Call Back If:  Fever lasts more than 3 days  Runny nose lasts more than 10 days  Cough lasts more than 3 weeks  Patient Will Follow Care Advice: No appointments available in the office- she would like to go to [facility].

## 2013-05-27 NOTE — Telephone Encounter (Signed)
[facility]rist[facility]en[facility] - [address] requesting new script of Novofine 32G Tip EA #100, last filled 06/16/12

## 2013-05-27 NOTE — Telephone Encounter (Signed)
FYI

## 2013-05-28 MED ORDER — INSULIN PEN NEEDLE 32G X 6 MM MISC: 1.0000 | Freq: Every day | Status: DC

## 2013-05-28 NOTE — Telephone Encounter (Signed)
Rx sent to pharmacy   

## 2013-06-17 ENCOUNTER — Ambulatory Visit: Payer: BC Managed Care – PPO | Admitting: Physical Medicine & Rehabilitation

## 2013-06-22 ENCOUNTER — Other Ambulatory Visit: Payer: Self-pay | Admitting: Family Medicine

## 2013-06-24 NOTE — Telephone Encounter (Signed)
Left a message for pt to return call to make follow up appt and medication will be called in.

## 2013-06-28 ENCOUNTER — Ambulatory Visit (HOSPITAL_BASED_OUTPATIENT_CLINIC_OR_DEPARTMENT_OTHER): Payer: BC Managed Care – PPO | Admitting: Physical Medicine & Rehabilitation

## 2013-06-28 ENCOUNTER — Encounter: Payer: Self-pay | Admitting: Physical Medicine & Rehabilitation

## 2013-06-28 ENCOUNTER — Encounter: Payer: BC Managed Care – PPO | Attending: Physical Medicine & Rehabilitation

## 2013-06-28 VITALS — BP 138/86 | HR 84 | Resp 14 | Ht 67.5 in | Wt 234.0 lb

## 2013-06-28 DIAGNOSIS — K5120 Ulcerative (chronic) proctitis without complications: Secondary | ICD-10-CM

## 2013-06-28 DIAGNOSIS — M461 Sacroiliitis, not elsewhere classified: Secondary | ICD-10-CM

## 2013-06-28 DIAGNOSIS — M47817 Spondylosis without myelopathy or radiculopathy, lumbosacral region: Secondary | ICD-10-CM | POA: Insufficient documentation

## 2013-06-28 DIAGNOSIS — M76899 Other specified enthesopathies of unspecified lower limb, excluding foot: Secondary | ICD-10-CM | POA: Insufficient documentation

## 2013-06-28 MED ORDER — TRAMADOL HCL 50 MG PO TABS: 25.0000 mg | ORAL_TABLET | Freq: Every day | ORAL | Status: AC

## 2013-06-28 NOTE — Progress Notes (Signed)
Subjective:    Patient ID: Ashley Velazquez, female    DOB: [birth year], 53 y.o.   MRN: [id number]  HPI Jan 8  Right sacroiliac injection under fluoroscopic guidance, 100%  Back relief for 2 days Right hip pain improved >50% for 1 month  Has had more left sided pain, similar area to Right   Right> Left symptoms  Pain Inventory Average Pain 7 Pain Right Now 7 My pain is sharp, burning, tingling and aching  In the last 24 hours, has pain interfered with the following? General activity 4 Relation with others 4 Enjoyment of life 4 What TIME of day is your pain at its worst? varies Sleep (in general) Poor  Pain is worse with: walking, bending, inactivity, standing and some activites Pain improves with: n/a Relief from Meds: 1  Mobility walk without assistance how many minutes can you walk? 15 ability to climb steps?  yes do you drive?  yes  Function disabled: date disabled 12/2003  Neuro/Psych dizziness depression anxiety  Prior Studies Any changes since last visit?  no  Physicians involved in your care Any changes since last visit?  no   Family History  Problem Relation Age of Onset  . Uterine cancer Mother     uterin cancer  . Hyperlipidemia Mother   . Alcohol abuse Father   . Cancer Father     lung and brain  . Hyperlipidemia Father   . Heart disease Father   . Hypertension Father   . Drug abuse Maternal Grandmother   . Drug abuse Maternal Grandfather    History   Social History  . Marital Status: Married    Spouse Name: N/A    Number of Children: 2  . Years of Education: N/A   Occupational History  . Disabled    Social History Main Topics  . Smoking status: Never Smoker   . Smokeless tobacco: Never Used  . Alcohol Use: No  . Drug Use: No  . Sexual Activity: None   Other Topics Concern  . None   Social History Narrative   Work or School: on permanent disability after multiple surgeries for crohn's disease      Home Situation:  lives with husband      Spiritual Beliefs:      Lifestyle: walks daily 10 minutes, working on improving diet               Past Surgical History  Procedure Laterality Date  . Abdominal hysterectomy  1997  . Colon surgery  2006    partial colectomy  . Appendectomy  1997  . Tonsillectomy and adenoidectomy    . Cesarean section  1993, 1995  . Hernia repair  2006/2008    x2  . Colonoscopy  10/11/2012    Dr. Hilarie Fredrickson   Past Medical History  Diagnosis Date  . Diabetes mellitus without complication   . Hyperlipidemia   . Crohn disease   . Depression   . Chicken pox   . Depression   . Headache(784.0)     frequent  . Migraines   . Colon polyps    BP 138/86  Pulse 84  Resp 14  Ht 5' 7.5" (1.715 m)  Wt 234 lb (106.142 kg)  BMI 36.09 kg/m2  SpO2 97%  Opioid Risk Score:   Fall Risk Score: Low Fall Risk (0-5 points) (patient educated handout declined)    Review of Systems  Constitutional: Positive for diaphoresis.  Respiratory: Positive for shortness of breath.   Gastrointestinal:  Positive for nausea, abdominal pain and diarrhea.  Neurological: Positive for dizziness.  Psychiatric/Behavioral: Positive for dysphoric mood. The patient is nervous/anxious.   All other systems reviewed and are negative.       Objective:   Physical Exam  Positive fabers maneuver right side >> Left side with pain at the PSIS area Negative straight leg raise test  Tenderness over the right greater trochanter of the femur Motor strength is 5/5 bilateral hip flexors knee extensors ankle dorsiflexors and plantar flexors      Assessment & Plan:  1.  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 No other symptoms to suggest a more widespread rheumatologic disease. The patient has a history of ulcerative proctitis  which may be associated with sacroiliitis And  states would likely be beneficial however relatively contraindicated with inflammatory bowel disease Patient does not tolerate tramadol very well at night and 50 mg does have a hangover affect. Will produce 25 mg each bedtime

## 2013-06-28 NOTE — Patient Instructions (Signed)
Sacroiliac Joint Dysfunction  The sacroiliac joint connects the lower part of the spine (the sacrum) with the bones of the pelvis.  CAUSES   Sometimes, there is no obvious reason for sacroiliac joint dysfunction. Other times, it may occur    During pregnancy.   After injury, such as:   Car accidents.   Sport-related injuries.   Work-related injuries.   Due to one leg being shorter than the other.   Due to other conditions that affect the joints, such as:   Rheumatoid arthritis.   Gout.   Psoriasis.   Joint infection (septic arthritis).  SYMPTOMS   Symptoms may include:   Pain in the:   Lower back.   Buttocks.   Groin.   Thighs and legs.   Difficult sitting, standing, walking, lying, bending or lifting.  DIAGNOSIS   A number of tests may be used to help diagnose the cause of sacroiliac joint dysfunction, including:   Imaging tests to look for other causes of pain, including:   MRI.   CT scan.   Bone scan.   Diagnostic injection: During a special x-ray (called fluoroscopy), a needle is put into the sacroiliac joint. A numbing medicine is injected into the joint. If the pain is improved or stopped, the diagnosis of sacroiliac joint dysfunction is more likely.  TREATMENT   There are a number of types of treatment used for sacroiliac joint dysfunction, including:   Only take over-the-counter or prescription medicines for pain, discomfort, or fever as directed by your caregiver.   Medications to relax muscles.   Rest. Decreasing activity can help cut down on painful muscle spasms and allow the back to heal.   Application of heat or ice to the lower back may improve muscle spasms and soothe pain.   Brace. A special back brace, called a sacroiliac belt, can help support the joint while your back is healing.   Physical therapy can help teach comfortable positions and exercises to strengthen muscles that support the sacroiliac joint.   Cortisone injections. Injections of steroid medicine into the  joint can help decrease swelling and improve pain.   Hyaluronic acid injections. This chemical improves lubrication within the sacroiliac joint, thereby decreasing pain.   Radiofrequency ablation. A special needle is placed into the joint, where it burns away nerves that are carrying pain messages from the joint.   Surgery. Because pain occurs during movement of the joint, screws and plates may be installed in order to limit or prevent joint motion.  HOME CARE INSTRUCTIONS    Take all medications exactly as directed.   Follow instructions regarding both rest and physical activity, to avoid worsening the pain.   Do physical therapy exercises exactly as prescribed.  SEEK IMMEDIATE MEDICAL CARE IF:   You experience increasingly severe pain.   You develop new symptoms, such as numbness or tingling in your legs or feet.   You lose bladder or bowel control.  Document Released: 07/08/2008 Document Revised: 07/04/2011 Document Reviewed: 07/08/2008  ExitCare Patient Information 2014 [employer].

## 2013-07-16 ENCOUNTER — Ambulatory Visit: Payer: BC Managed Care – PPO | Admitting: Physical Medicine & Rehabilitation

## 2013-07-19 ENCOUNTER — Ambulatory Visit: Payer: BC Managed Care – PPO | Admitting: Physical Medicine & Rehabilitation

## 2013-07-23 ENCOUNTER — Other Ambulatory Visit: Payer: Self-pay | Admitting: Family Medicine

## 2013-08-01 ENCOUNTER — Encounter: Payer: BC Managed Care – PPO | Attending: Physical Medicine & Rehabilitation

## 2013-08-01 ENCOUNTER — Encounter: Payer: Self-pay | Admitting: Physical Medicine & Rehabilitation

## 2013-08-01 ENCOUNTER — Ambulatory Visit (HOSPITAL_BASED_OUTPATIENT_CLINIC_OR_DEPARTMENT_OTHER): Payer: BC Managed Care – PPO | Admitting: Physical Medicine & Rehabilitation

## 2013-08-01 VITALS — BP 126/78 | HR 77 | Resp 14 | Ht 67.5 in | Wt 232.6 lb

## 2013-08-01 DIAGNOSIS — M461 Sacroiliitis, not elsewhere classified: Secondary | ICD-10-CM

## 2013-08-01 DIAGNOSIS — M76899 Other specified enthesopathies of unspecified lower limb, excluding foot: Secondary | ICD-10-CM | POA: Insufficient documentation

## 2013-08-01 DIAGNOSIS — M47817 Spondylosis without myelopathy or radiculopathy, lumbosacral region: Secondary | ICD-10-CM | POA: Insufficient documentation

## 2013-08-01 NOTE — Progress Notes (Signed)
Bilateral sacroiliac injections under fluoroscopic guidance, 100% relief with R SI injection Jan 2015 which has worn off, pt with bilateral symptoms  Indication: Low back and buttocks pain not relieved by medication management and other conservative care.  Informed consent was obtained after describing risks and benefits of the procedure with the patient, this includes bleeding, bruising, infection, paralysis and medication side effects. The patient wishes to proceed and has given written consent. The patient was placed in a prone position. The lumbar and sacral area was marked and prepped with Betadine. A 25-gauge 1-1/2 inch needle was inserted into the skin and subcutaneous tissue and 1 mL of 1% lidocaine was injected into each side. Then a 25-gauge 3 inch spinal needle was inserted under fluoroscopic guidance into the left sacroiliac joint. AP and lateral images were utilized. Omnipaque 180x0.5 mL under live fluoroscopy demonstrated no intravascular uptake. Then a solution containing one ML of 40 mg per mL Depakote met drawl in 2 ML of 2% lidocaine MPF was injected x1.5 mL. This same procedure was repeated on the right side using the same needle, injectate, and technique. Patient tolerated the procedure well. Post procedure instructions were given. Please see post procedure form.

## 2013-08-01 NOTE — Patient Instructions (Signed)

## 2013-08-01 NOTE — Progress Notes (Signed)
  PROCEDURE RECORD [facility]   Name: Seher Schlagel DOB:12-03-1960 MRN: [id number]  Date:08/01/2013  Physician: Alysia Penna, MD    Nurse/CMA: Shumaker RN  Allergies:  Allergies  Allergen Reactions  . Cymbalta [Duloxetine Hcl] Other (See Comments)    tachycardia    Consent Signed: yes  Is patient diabetic? yes  CBG today? 132  Pregnant: no LMP: No LMP recorded. Patient has had a hysterectomy. (age 53-55)  Anticoagulants: no Anti-inflammatory: no Antibiotics: no  Procedure: Bilateral Sacroiliac Steroid injection Position: Prone Start Time: 9:21 End Time: 9:27  Fluoro Time: 19 sec  RN/CMA Biomedical engineer    Time 8:58 9:30    BP 126/76 142/57    Pulse 77 89    Respirations 14 14    O2 Sat 97 98    S/S 6 6    Pain Level 7/10 5/10     D/C home with husband Josph Macho, patient A & O X 3, D/C instructions reviewed, and sits independently.

## 2013-08-21 ENCOUNTER — Other Ambulatory Visit: Payer: Self-pay | Admitting: Family Medicine

## 2013-08-21 NOTE — Telephone Encounter (Signed)
For Dr. Maudie Mercury

## 2013-08-23 ENCOUNTER — Telehealth: Payer: Self-pay

## 2013-08-23 NOTE — Telephone Encounter (Signed)
Pt called and i was able to schedule an appt with dr. Maudie Mercury, so that pt could get med refilled

## 2013-08-23 NOTE — Telephone Encounter (Signed)
Called pt in regards to refill notification, and wanted to let her know that she needed to schedule an appt to receive further refills. Unable to reach pt at this time

## 2013-09-09 ENCOUNTER — Ambulatory Visit: Payer: BC Managed Care – PPO | Admitting: Physical Medicine & Rehabilitation

## 2013-09-11 ENCOUNTER — Ambulatory Visit: Payer: BC Managed Care – PPO | Admitting: Family Medicine

## 2013-09-14 ENCOUNTER — Other Ambulatory Visit: Payer: Self-pay | Admitting: Family Medicine

## 2013-09-14 ENCOUNTER — Other Ambulatory Visit: Payer: Self-pay | Admitting: Internal Medicine

## 2013-09-16 NOTE — Telephone Encounter (Signed)
Needs appt for further refills.

## 2013-09-24 ENCOUNTER — Ambulatory Visit (INDEPENDENT_AMBULATORY_CARE_PROVIDER_SITE_OTHER): Payer: BC Managed Care – PPO | Admitting: Family Medicine

## 2013-09-24 ENCOUNTER — Encounter: Payer: Self-pay | Admitting: Family Medicine

## 2013-09-24 VITALS — BP 120/86 | HR 101 | Temp 98.4°F | Ht 67.5 in | Wt 234.0 lb

## 2013-09-24 DIAGNOSIS — E1142 Type 2 diabetes mellitus with diabetic polyneuropathy: Secondary | ICD-10-CM

## 2013-09-24 DIAGNOSIS — F32A Depression, unspecified: Secondary | ICD-10-CM

## 2013-09-24 DIAGNOSIS — E785 Hyperlipidemia, unspecified: Secondary | ICD-10-CM

## 2013-09-24 DIAGNOSIS — K5090 Crohn's disease, unspecified, without complications: Secondary | ICD-10-CM

## 2013-09-24 DIAGNOSIS — E1165 Type 2 diabetes mellitus with hyperglycemia: Secondary | ICD-10-CM

## 2013-09-24 DIAGNOSIS — IMO0002 Reserved for concepts with insufficient information to code with codable children: Secondary | ICD-10-CM

## 2013-09-24 DIAGNOSIS — E1149 Type 2 diabetes mellitus with other diabetic neurological complication: Secondary | ICD-10-CM

## 2013-09-24 DIAGNOSIS — F329 Major depressive disorder, single episode, unspecified: Secondary | ICD-10-CM

## 2013-09-24 DIAGNOSIS — F3289 Other specified depressive episodes: Secondary | ICD-10-CM

## 2013-09-24 MED ORDER — CITALOPRAM HYDROBROMIDE 20 MG PO TABS: 30.0000 mg | ORAL_TABLET | Freq: Every day | ORAL | Status: DC

## 2013-09-24 MED ORDER — ATORVASTATIN CALCIUM 40 MG PO TABS: 40.0000 mg | ORAL_TABLET | Freq: Every day | ORAL | Status: AC

## 2013-09-24 MED ORDER — BUPROPION HCL ER (XL) 150 MG PO TB24: 150.0000 mg | ORAL_TABLET | Freq: Every day | ORAL | Status: DC

## 2013-09-24 NOTE — Progress Notes (Signed)
Pre visit review using our clinic review tool, if applicable. No additional management support is needed unless otherwise documented below in the visit note. 

## 2013-09-24 NOTE — Patient Instructions (Addendum)
-  restart medications  -CALL YOUR ENDOCRINOLOGIST TODAY FOR AN APPOINTMENT  FOR YOUR DIABETES:  []  Eat a healthy low carb diet (avoid sweets, sweet drinks, breads, potatoes, rice, etc.) and ensure 3 small meals daily.  []  Get AT LEAST 150 minutes of cardiovascular exercise per week - 30 minutes per day is best of sustained sweaty exercise.  []  Take all of your medications every day as directed by your doctor. Call your doctor immediately if you have any questions about your medications or are running low.  []  Check your blood sugar often and when you feel unwell and keep a log to bring to all health appointments. FASTING: before you eat anything in the morning POSTPRANDIAL: 1-2 hours after a meal  []  If any low blood sugars < 70, eat a snack and call your doctor immediately.  []  See an eye doctor every year and fax your diabetic eye exam to our office.  Fax: [phone]  []  Take good care of your feet and keep them soft and callus free. Check your feet daily and wear comfortable shoes. See your doctor immediately if you have any cuts, calluses or wounds on your feet.  Follow up in 3 months for your physical and labs - schedule this on the way out

## 2013-09-24 NOTE — Progress Notes (Signed)
No chief complaint on file.   HPI:  Follow up:  Note: last seen 12/13/12 and told to follow up in 2 months - returns today 10 months later  Depression: -adjusted meds and was advised to see psychiatry and counselor after last visit as had treatment resistent depression and up follow up in one month -did not follow treatment recommendations -on celexa and wellbutrin in the past - but ran out of wellbutrin last week -about to run out of citalopram -did not see the psychiatrist - as reports feels fine now  HLD: -meds: atorvastatin -ran out of this medication and stopped it  DM: -chronically very uncontrolled and referred to endocrinologist for management but appears despite her telling me on several occassions she was  Scheduled to see them that she has not -does not appear that she followed recommendations - reports she thinks diabetes was straightened out until the last week - so did not follow up as can't go to appts on [place] -walking daily and 2 sodas per day, trying to work on eating more healthy -has had a few low blood sugars -reports she doesn't think diabetes is serious and often doesn't take her medications  Chrohn's/GERD: -followed by GI - reports has appt in 2 weeks     ROS: See pertinent positives and negatives per HPI.  Past Medical History  Diagnosis Date  . Diabetes mellitus without complication   . Hyperlipidemia   . Crohn disease   . Depression   . Chicken pox   . Depression   . Headache(784.0)     frequent  . Migraines   . Colon polyps     Past Surgical History  Procedure Laterality Date  . Abdominal hysterectomy  1997  . Colon surgery  2006    partial colectomy  . Appendectomy  1997  . Tonsillectomy and adenoidectomy    . Cesarean section  1993, 1995  . Hernia repair  2006/2008    x2  . Colonoscopy  10/11/2012    Dr. Hilarie Fredrickson    Family History  Problem Relation Age of Onset  . Uterine cancer Mother     uterin cancer  . Hyperlipidemia  Mother   . Alcohol abuse Father   . Cancer Father     lung and brain  . Hyperlipidemia Father   . Heart disease Father   . Hypertension Father   . Drug abuse Maternal Grandmother   . Drug abuse Maternal Grandfather     History   Social History  . Marital Status: Married    Spouse Name: N/A    Number of Children: 2  . Years of Education: N/A   Occupational History  . Disabled    Social History Main Topics  . Smoking status: Never Smoker   . Smokeless tobacco: Never Used  . Alcohol Use: No  . Drug Use: No  . Sexual Activity: None   Other Topics Concern  . None   Social History Narrative   Work or School: on permanent disability after multiple surgeries for crohn's disease      Home Situation: lives with husband      Spiritual Beliefs:      Lifestyle: walks daily 10 minutes, working on improving diet                Current outpatient prescriptions:ACCU-CHEK AVIVA PLUS test strip, CHECK 3 TIMES DAILY., Disp: 100 each, Rfl: 4;  buPROPion (WELLBUTRIN XL) 150 MG 24 hr tablet, Take 1 tablet (150 mg total) by  mouth daily., Disp: 90 tablet, Rfl: 3;  citalopram (CELEXA) 20 MG tablet, Take 1.5 tablets (30 mg total) by mouth daily., Disp: 45 tablet, Rfl: 6 hyoscyamine (LEVSIN SL) 0.125 MG SL tablet, Place 1 tablet (0.125 mg total) under the tongue every 4 (four) hours as needed for cramping., Disp: 30 tablet, Rfl: 2;  insulin aspart (NOVOLOG) 100 UNIT/ML SOPN FlexPen, SIG: PER SLIDING SCALE 7-13 UNITS BEFORE MEALS., Disp: 15 mL, Rfl: 3;  Insulin Pen Needle (NOVOFINE) 32G X 6 MM MISC, 1 each by Other route daily., Disp: 100 each, Rfl: 11 Lancets (ACCU-CHEK MULTICLIX) lancets, Test blood sugar 3 times daily., Disp: 100 each, Rfl: 5;  LANTUS SOLOSTAR 100 UNIT/ML Solostar Pen, INJECT 50 UNITS INTO THE SKIN DAILY AT BEDTIME., Disp: 15 mL, Rfl: 0;  metFORMIN (GLUCOPHAGE) 1000 MG tablet, TAKE 1 TABLET (1,000 MG TOTAL) BY MOUTH 2 (TWO) TIMES DAILY WITH A MEAL., Disp: 60 tablet, Rfl: 2;   NOVOLOG FLEXPEN 100 UNIT/ML injection, Sliding scale, Disp: , Rfl:  ondansetron (ZOFRAN ODT) 4 MG disintegrating tablet, Take 1 tablet (4 mg total) by mouth every 8 (eight) hours as needed for nausea., Disp: 20 tablet, Rfl: 0;  pantoprazole (PROTONIX) 40 MG tablet, Take 1 tablet (40 mg total) by mouth daily., Disp: 90 tablet, Rfl: 3;  traMADol (ULTRAM) 50 MG tablet, Take 0.5 tablets (25 mg total) by mouth at bedtime., Disp: 30 tablet, Rfl: 1 atorvastatin (LIPITOR) 40 MG tablet, Take 1 tablet (40 mg total) by mouth daily., Disp: 90 tablet, Rfl: 3  EXAM:  Filed Vitals:   09/24/13 1335  BP: 120/86  Pulse: 101  Temp: 98.4 F (36.9 C)    Body mass index is 36.09 kg/(m^2).  GENERAL: vitals reviewed and listed above, alert, oriented, appears well hydrated and in no acute distress  HEENT: atraumatic, conjunttiva clear, no obvious abnormalities on inspection of external nose and ears  NECK: no obvious masses on inspection  LUNGS: clear to auscultation bilaterally, no wheezes, rales or rhonchi, good air movement  CV: HRRR, no peripheral edema  MS: moves all extremities without noticeable abnormality  PSYCH: pleasant and cooperative, no obvious depression or anxiety  ASSESSMENT AND PLAN:  Discussed the following assessment and plan:  Depression - Plan: citalopram (CELEXA) 20 MG tablet, buPROPion (WELLBUTRIN XL) 150 MG 24 hr tablet  Hyperlipemia - Plan: atorvastatin (LIPITOR) 40 MG tablet  Uncontrolled type 2 diabetes mellitus with peripheral neuropathy  Crohn's disease  -unfortunately, Ms. Sianez has not followed treatment recommendations, admits does not take her diabetes or health seriously and continues to deal with poorly controlled chronic health issues  -we discussed these issues today and have offered to help but advised if does not find our advice useful enough to follow recommendations that we will ask her to find another primary care doctor -advised: call her  endocrinologist today for follow up - I advised I will no longer refill her diabetes medications as she needs to see endocrinologist, keeping track of blood sugars to share with endocrinologist, yearly preventive visit, restart statin and wellbutrin and if depression worsens again see psych -follow up in 3 months for physical -Patient advised to return or notify a doctor immediately if symptoms worsen or persist or new concerns arise.  Patient Instructions  -restart medications  -CALL YOUR ENDOCRINOLOGIST TODAY FOR AN APPOINTMENT  FOR YOUR DIABETES:  []  Eat a healthy low carb diet (avoid sweets, sweet drinks, breads, potatoes, rice, etc.) and ensure 3 small meals daily.  []  Get AT LEAST 150  minutes of cardiovascular exercise per week - 30 minutes per day is best of sustained sweaty exercise.  []  Take all of your medications every day as directed by your doctor. Call your doctor immediately if you have any questions about your medications or are running low.  []  Check your blood sugar often and when you feel unwell and keep a log to bring to all health appointments. FASTING: before you eat anything in the morning POSTPRANDIAL: 1-2 hours after a meal  []  If any low blood sugars < 70, eat a snack and call your doctor immediately.  []  See an eye doctor every year and fax your diabetic eye exam to our office.  Fax: [phone]  []  Take good care of your feet and keep them soft and callus free. Check your feet daily and wear comfortable shoes. See your doctor immediately if you have any cuts, calluses or wounds on your feet.  Follow up in 3 months for your physical and labs - schedule this on the way out      Lucretia Kern

## 2013-10-02 ENCOUNTER — Encounter: Payer: Self-pay | Admitting: Internal Medicine

## 2013-10-02 ENCOUNTER — Ambulatory Visit (INDEPENDENT_AMBULATORY_CARE_PROVIDER_SITE_OTHER): Payer: BC Managed Care – PPO | Admitting: Internal Medicine

## 2013-10-02 VITALS — BP 118/78 | HR 96 | Temp 97.6°F | Resp 12 | Wt 231.0 lb

## 2013-10-02 DIAGNOSIS — E1142 Type 2 diabetes mellitus with diabetic polyneuropathy: Secondary | ICD-10-CM

## 2013-10-02 DIAGNOSIS — IMO0002 Reserved for concepts with insufficient information to code with codable children: Secondary | ICD-10-CM

## 2013-10-02 DIAGNOSIS — E1149 Type 2 diabetes mellitus with other diabetic neurological complication: Secondary | ICD-10-CM

## 2013-10-02 DIAGNOSIS — E1165 Type 2 diabetes mellitus with hyperglycemia: Principal | ICD-10-CM

## 2013-10-02 LAB — COMPREHENSIVE METABOLIC PANEL
ALT: 28 U/L (ref 0–35)
AST: 27 U/L (ref 0–37)
Albumin: 4 g/dL (ref 3.5–5.2)
Alkaline Phosphatase: 101 U/L (ref 39–117)
BUN: 11 mg/dL (ref 6–23)
CHLORIDE: 106 meq/L (ref 96–112)
CO2: 22 mEq/L (ref 19–32)
CREATININE: 0.7 mg/dL (ref 0.4–1.2)
Calcium: 9.8 mg/dL (ref 8.4–10.5)
GFR: 93.6 mL/min (ref >60.00)
Glucose, Bld: 147 mg/dL — ABNORMAL HIGH (ref 70–99)
Potassium: 4.2 mEq/L (ref 3.5–5.1)
SODIUM: 139 meq/L (ref 135–145)
TOTAL PROTEIN: 7.2 g/dL (ref 6.0–8.3)
Total Bilirubin: 0.4 mg/dL (ref 0.2–1.2)

## 2013-10-02 LAB — HEMOGLOBIN A1C: Hgb A1c MFr Bld: 9.1 % — ABNORMAL HIGH (ref 4.6–6.5)

## 2013-10-02 MED ORDER — INSULIN ASPART 100 UNIT/ML ~~LOC~~ SOLN: SUBCUTANEOUS | Status: DC

## 2013-10-02 MED ORDER — INSULIN GLARGINE 100 UNIT/ML SOLOSTAR PEN: 40.0000 [IU] | PEN_INJECTOR | Freq: Every day | SUBCUTANEOUS | Status: DC

## 2013-10-02 NOTE — Progress Notes (Signed)
Subjective:     Patient ID: Ashley Velazquez, female   DOB: 1960-10-29, 53 y.o.   MRN: [id number]  HPI Ashley Velazquez is a pleasant 53 y.o. woman, returning for f/u for DM 2, dx 2012, uncontrolled, insulin-dependent started on Insulin 6 mo after dx, with complications (peripheral neuropathy). Last visit 14 mo ago!  Last A1c: Lab Results  Component Value Date   HGBA1C 13.2* 06/28/2012   She is on: - Metformin 1000 mg bid - Lantus 80 >> 50 units in HS (pen) - Novolog 10 units tid  - may skip lunchtime as she forgets (pen) - NovoLog SSI: target 150, ISF 20  She checks her sugars 2-3x a day but only brings a log for the last 2 weeks: - am: (occ. 86-)112-130 >> 127-184, lately <147 after she switched to cheerios - before lunch: 121, 155 (before switching to cheerios, 240-280) - 2h after lunch: 150-300s >> 198 - before dinner: 122-188 (248) Lowest 75  - 2 mo ago (one night 42), hypoglycemia awareness <100. Highest: low 300s (after dessert).   Her diet is: - Breakfast: bowl of cereals - Lunch: sandwich - Dinner: meat and vegetables - Snacks: 2-3 a day: Rice cakes, bananas, oranges, yogurt or Jell-O  - She has peripheral neuropathy. - Last lipids: Lab Results  Component Value Date   CHOL 153 06/28/2012   HDL 39* 06/28/2012   LDLCALC 50 06/28/2012   TRIG 321* 06/28/2012   CHOLHDL 3.9 06/28/2012  On Lipitor. - No CKD, but had MAU at last check: Component     Latest Ref Rng 06/28/2012  Microalb, Ur     0.00 - 1.89 mg/dL 27.28 (H)  Creatinine, Urine      153.0  MICROALB/CREAT RATIO     0.0 - 30.0 mg/g 178.3 (H)  Last BUN and Cr: Lab Results  Component Value Date   BUN 14 09/21/2012   Lab Results  Component Value Date   CREATININE 0.7 09/21/2012  She is not on an ACEI. Last eye exam 1 year ago. No DR.   I reviewed pt's medications, allergies, PMH, social hx, family hx and no changes required, except as mentioned above. Also, started tramadol for sciatica.  Review of  Systems Constitutional: no weight gain/loss, + fatigue, + subjective hyperthermia, + poor sleep Eyes: no blurry vision, no xerophthalmia ENT: no sore throat, no nodules palpated in throat, no dysphagia/odynophagia, no hoarseness, + decreased hearing Cardiovascular: no CP/SOB/palpitations/swelling in hands Respiratory: no cough/+ SOB Gastrointestinal: + N/no V/D/C Musculoskeletal: + both muscle/joint aches Skin: no rashes, + itching and hair loss Neurological: no tremors/numbness/tingling/dizziness, hypoacusis, no headaches  Objective:   Physical Exam BP 118/78  Pulse 96  Temp(Src) 97.6 F (36.4 C) (Oral)  Resp 12  Wt 231 lb (104.781 kg)  SpO2 96% Wt Readings from Last 3 Encounters:  10/02/13 231 lb (104.781 kg)  09/24/13 234 lb (106.142 kg)  08/01/13 232 lb 9.6 oz (105.507 kg)   Constitutional: overweight, in NAD Eyes: PERRLA, EOMI, no exophthalmos ENT: moist mucous membranes, no thyromegaly, no cervical lymphadenopathy Cardiovascular: RRR, No MRG Respiratory: CTA B Gastrointestinal: abdomen soft, NT, ND, BS+ Musculoskeletal: no deformities, strength intact in all 4 Skin: moist, warm, no rashes Neurological: no tremor with outstretched hands, DTR normal in all 4  Assessment:     1. DM2, uncontrolled, insulin-dependent, with complications - Peripheral neuropathy     Plan:     Patient with uncontrolled diabetes, returning after a long absence of >1 year. No recent HbA1c. Brings  a log for the last 2 weeks and the sugars are improving in last week. She may miss lunchtime insulin. Snacks between meals and at bedtime. Has few lows. - we discussed about the fact that the physiologic diabetic regimen would have approximately equal mealtime and basal total daily dose of insulin, so today I advised her to: Patient Instructions  - Please continue Metformin 1000 mg 2x a day - Decrease Lantus to 40 units at bedtime - Increase Novolog to 13 units 3x a day 15 min before a meal. Try to  inject for lunch, too or at least document on the log if you missed the dose. - Continue current Sliding scale of NovoLog: - 150- 170: + 1 unit  - 171- 190: + 2 units  - 191- 210: + 3 units  - 211- 230: + 4 units  - 231- 250: + 5 units  - >250: + 6 units  Please return in 1.5 month with your sugar log.  Please stop at the lab. - given new sugar logs and advised how to fill it and to bring it at next appt - will check a CMP and HbA1c today - I will see her back in 1.5 mo      Office Visit on 10/02/2013  Component Date Value Ref Range Status  . Sodium 10/02/2013 139  135 - 145 mEq/L Final  . Potassium 10/02/2013 4.2  3.5 - 5.1 mEq/L Final  . Chloride 10/02/2013 106  96 - 112 mEq/L Final  . CO2 10/02/2013 22  19 - 32 mEq/L Final  . Glucose, Bld 10/02/2013 147* 70 - 99 mg/dL Final  . BUN 10/02/2013 11  6 - 23 mg/dL Final  . Creatinine, Ser 10/02/2013 0.7  0.4 - 1.2 mg/dL Final  . Total Bilirubin 10/02/2013 0.4  0.2 - 1.2 mg/dL Final  . Alkaline Phosphatase 10/02/2013 101  39 - 117 U/L Final  . AST 10/02/2013 27  0 - 37 U/L Final  . ALT 10/02/2013 28  0 - 35 U/L Final  . Total Protein 10/02/2013 7.2  6.0 - 8.3 g/dL Final  . Albumin 10/02/2013 4.0  3.5 - 5.2 g/dL Final  . Calcium 10/02/2013 9.8  8.4 - 10.5 mg/dL Final  . GFR 10/02/2013 93.60  >60.00 mL/min Final  . Hemoglobin A1C 10/02/2013 9.1* 4.6 - 6.5 % Final   Glycemic Control Guidelines for People with Diabetes:Non Diabetic:  <6%Goal of Therapy: <7%Additional Action Suggested:  >8%    A1c improved, but still high. See plan above. Normal kidney fxn.

## 2013-10-02 NOTE — Patient Instructions (Signed)
-   Please continue Metformin 1000 mg 2x a day - Decrease Lantus to 40 units at bedtime - Increase Novolog to 13 units 3x a day 15 min before a meal. Try to inject for lunch, too or at least document on the log if you missed the dose. - Continue current Sliding scale of NovoLog: - 150- 170: + 1 unit  - 171- 190: + 2 units  - 191- 210: + 3 units  - 211- 230: + 4 units  - 231- 250: + 5 units  - >250: + 6 units  Please return in 1.5 month with your sugar log.   Please stop at the lab.

## 2013-10-03 ENCOUNTER — Encounter: Payer: Self-pay | Admitting: Internal Medicine

## 2013-10-07 ENCOUNTER — Ambulatory Visit: Payer: BC Managed Care – PPO | Admitting: Internal Medicine

## 2013-10-07 ENCOUNTER — Telehealth: Payer: Self-pay | Admitting: Internal Medicine

## 2013-10-17 ENCOUNTER — Other Ambulatory Visit: Payer: Self-pay | Admitting: *Deleted

## 2013-10-17 MED ORDER — INSULIN ASPART 100 UNIT/ML ~~LOC~~ SOLN: SUBCUTANEOUS | Status: DC

## 2013-10-28 ENCOUNTER — Other Ambulatory Visit: Payer: Self-pay | Admitting: Internal Medicine

## 2013-11-13 ENCOUNTER — Other Ambulatory Visit: Payer: Self-pay | Admitting: Internal Medicine

## 2013-11-13 ENCOUNTER — Ambulatory Visit: Payer: BC Managed Care – PPO | Admitting: Internal Medicine

## 2013-11-13 ENCOUNTER — Other Ambulatory Visit: Payer: Self-pay | Admitting: *Deleted

## 2013-11-13 MED ORDER — INSULIN GLARGINE 100 UNIT/ML SOLOSTAR PEN: 40.0000 [IU] | PEN_INJECTOR | Freq: Every day | SUBCUTANEOUS | Status: DC

## 2013-11-27 ENCOUNTER — Ambulatory Visit (INDEPENDENT_AMBULATORY_CARE_PROVIDER_SITE_OTHER): Payer: BC Managed Care – PPO | Admitting: Internal Medicine

## 2013-11-27 ENCOUNTER — Encounter: Payer: Self-pay | Admitting: Internal Medicine

## 2013-11-27 VITALS — BP 122/68 | HR 94 | Temp 98.1°F | Resp 12 | Wt 229.8 lb

## 2013-11-27 DIAGNOSIS — IMO0002 Reserved for concepts with insufficient information to code with codable children: Secondary | ICD-10-CM

## 2013-11-27 DIAGNOSIS — E1149 Type 2 diabetes mellitus with other diabetic neurological complication: Secondary | ICD-10-CM

## 2013-11-27 DIAGNOSIS — E1165 Type 2 diabetes mellitus with hyperglycemia: Principal | ICD-10-CM

## 2013-11-27 DIAGNOSIS — E1142 Type 2 diabetes mellitus with diabetic polyneuropathy: Secondary | ICD-10-CM

## 2013-11-27 NOTE — Progress Notes (Signed)
Subjective:     Patient ID: Ashley Velazquez, female   DOB: 10/17/1960, 53 y.o.   MRN: [id number]  HPI Ashley Velazquez is a pleasant 53 y.o. woman, returning for f/u for DM 2, dx 2012, uncontrolled, insulin-dependent started on Insulin 6 mo after dx, with complications (peripheral neuropathy). Last visit 2 mo ago.  Last A1c: Lab Results  Component Value Date   HGBA1C 9.1* 10/02/2013   HGBA1C 13.2* 06/28/2012   She is on: - Metformin 1000 mg bid - Lantus 80 >> 50 >> 40 units in HS (pen) - Novolog 10 >> 13 units tid  - may skip lunchtime as she forgets (pen) - NovoLog SSI: target 150, ISF 20 She went out of town and forgot meds x 5 days. Sugars higher then.  She checks her sugars 2-3x a day: - am: (occ. 86-)112-130 >> 127-184, lately <147 after she switched to cheerios >> 136-224 - before lunch: 121, 155 (before switching to cheerios, 240-280) >> 176-214 - 2h after lunch: 150-300s >> 198 >> 147-342 - before dinner: 122-188 (248) >> 89 x1, 120-263 - bedtime: 68x1, 124-234 Lowest 68  x1 before bedtime, hypoglycemia awareness <100. Highest: low 372 (after dessert).   Her diet is: - Breakfast: bowl of cereals - Lunch: sandwich - Dinner: meat and vegetables - Snacks: 2-3 a day: Rice cakes, bananas, oranges, yogurt or Jell-O  - She has peripheral neuropathy. - Last lipids: Lab Results  Component Value Date   CHOL 153 06/28/2012   HDL 39* 06/28/2012   LDLCALC 50 06/28/2012   TRIG 321* 06/28/2012   CHOLHDL 3.9 06/28/2012  On Lipitor. - No CKD, but had MAU at last check: Component     Latest Ref Rng 06/28/2012  Microalb, Ur     0.00 - 1.89 mg/dL 27.28 (H)  Creatinine, Urine      153.0  MICROALB/CREAT RATIO     0.0 - 30.0 mg/g 178.3 (H)  Last BUN and Cr: Lab Results  Component Value Date   BUN 11 10/02/2013   Lab Results  Component Value Date   CREATININE 0.7 10/02/2013  She is not on an ACEI. Last eye exam ~1 year ago. No DR.   I reviewed pt's medications, allergies, PMH, social hx,  family hx and no changes required, except as mentioned above.   Review of Systems Constitutional: no weight gain/loss, no fatigue, no subjective hyperthermia Eyes: no blurry vision, no xerophthalmia ENT: no sore throat, no nodules palpated in throat, no dysphagia/odynophagia, no hoarseness, + decreased hearing Cardiovascular: no CP/SOB/palpitations/swelling in hands Respiratory: no cough/SOB Gastrointestinal: no N/V/D/C Musculoskeletal: no muscle/+ joint aches (R toe >> resolved) Skin: no rashes Neurological: no tremors/numbness/tingling/dizziness, hypoacusis, no headaches  Objective:   Physical Exam BP 122/68  Pulse 94  Temp(Src) 98.1 F (36.7 C) (Oral)  Resp 12  Wt 229 lb 12.8 oz (104.237 kg)  SpO2 95% Wt Readings from Last 3 Encounters:  11/27/13 229 lb 12.8 oz (104.237 kg)  10/02/13 231 lb (104.781 kg)  09/24/13 234 lb (106.142 kg)   Constitutional: overweight, in NAD Eyes: PERRLA, EOMI, no exophthalmos ENT: moist mucous membranes, no thyromegaly, no cervical lymphadenopathy Cardiovascular: RRR, No MRG Respiratory: CTA B Gastrointestinal: abdomen soft, NT, ND, BS+ Musculoskeletal: no deformities, strength intact in all 4 Skin: moist, warm, no rashes Neurological: no tremor with outstretched hands, DTR normal in all 4  Assessment:     1. DM2, uncontrolled, insulin-dependent, with complications - Peripheral neuropathy     Plan:     Patient  with uncontrolled diabetes, with multiple doses of mealtime insulin missed, especially when eating out. She forgot her meds home when she went to [place] for 1 week 1 mo ago... Subsequently, she has many high CBGs and 2 lows. - I advised her to: Patient Instructions  - Please continue Metformin 1000 mg 2x a day - Increase Lantus to 45 units at bedtime - Increase Novolog to: Smaller meal: 12 units Regular meal: 15 units  Large meal: 18 units (e.g. Eating out) - Continue current Sliding scale of NovoLog: - 150- 170: + 1 unit  -  171- 190: + 2 units  - 191- 210: + 3 units  - 211- 230: + 4 units  - 231- 250: + 5 units  - >250: + 6 units  Please return in 1.5 month with your sugar log.  - will check HbA1c at next visit - I will see her back in 1.5 mo

## 2013-11-27 NOTE — Patient Instructions (Signed)
-   Please continue Metformin 1000 mg 2x a day - Increase Lantus to 45 units at bedtime - Increase Novolog to: Smaller meal: 12 units Regular meal: 15 units  Large meal: 18 units (e.g. Eating out) - Continue current Sliding scale of NovoLog: - 150- 170: + 1 unit  - 171- 190: + 2 units  - 191- 210: + 3 units  - 211- 230: + 4 units  - 231- 250: + 5 units  - >250: + 6 units  Please return in 1.5 month with your sugar log.

## 2013-11-29 ENCOUNTER — Encounter: Payer: Self-pay | Admitting: *Deleted

## 2013-12-05 ENCOUNTER — Ambulatory Visit (INDEPENDENT_AMBULATORY_CARE_PROVIDER_SITE_OTHER): Payer: BC Managed Care – PPO | Admitting: Internal Medicine

## 2013-12-05 ENCOUNTER — Encounter: Payer: Self-pay | Admitting: Internal Medicine

## 2013-12-05 VITALS — BP 102/68 | HR 88 | Ht 67.0 in | Wt 230.4 lb

## 2013-12-05 DIAGNOSIS — K2970 Gastritis, unspecified, without bleeding: Secondary | ICD-10-CM

## 2013-12-05 DIAGNOSIS — R110 Nausea: Secondary | ICD-10-CM

## 2013-12-05 DIAGNOSIS — K2990 Gastroduodenitis, unspecified, without bleeding: Secondary | ICD-10-CM

## 2013-12-05 DIAGNOSIS — R1013 Epigastric pain: Secondary | ICD-10-CM

## 2013-12-05 DIAGNOSIS — Z791 Long term (current) use of non-steroidal anti-inflammatories (NSAID): Secondary | ICD-10-CM

## 2013-12-05 MED ORDER — SUCRALFATE 1 G PO TABS: 1.0000 g | ORAL_TABLET | Freq: Three times a day (TID) | ORAL | Status: DC

## 2013-12-05 MED ORDER — PANTOPRAZOLE SODIUM 40 MG PO TBEC: 40.0000 mg | DELAYED_RELEASE_TABLET | Freq: Two times a day (BID) | ORAL | Status: DC

## 2013-12-05 NOTE — Patient Instructions (Signed)
We have sent the following medications to your pharmacy for you to pick up at your convenience: pantoprazole 40 mg twice daily (increase from once daily) Carafate 1 g tablet-Take 1 tablet before meals and at bedtime  Please decrease or discontinue ibuprofen. You may take Tylenol in it's place. You may take up to 4 grams in a 24 hour period for joint pain.  You have been scheduled for a follow up appointment on Thursday 01/16/14 @ 9 am with Tye Savoy, NP  CC:Dr Colin Benton

## 2013-12-05 NOTE — Progress Notes (Signed)
[facility]  Ashley Velazquez    [id number]    1961-04-25    Assessment and Plan/Recommendations:  1. GERD/EPIGASTRIC PAIN/ulcer-like dyspepsia/NSAID use - Double protonix to 80 mg daily, and add Carafate.  Pt needs to cut back on ibuprofen, hopefully stop.  Pt knows it's ok to use up to 4 G of Tylenol daily for joint pains.  If no relief or change in next month pt will need to be scheduled for EGD.  May have ulcer 2/2 NSAID use. CBC today.   HPI: Ashley Velazquez is a 53 y.o. female with a hx of ileal colitis s/p ileocectomy and GERD presenting today with worsening diarrhea, nausea, bloating, and reflux symptoms over the past 4 months.  Pt states since April she has had all of these symptoms after eating and is not managed well on her home medications.  She states she has daily GERD symptoms despite 40 mg protonix, and has been supplementing with tums.  She does state that on days when she has forgotten her protonix her sxs are much worse.    Pt also states she has been taking 400 mg of Ibuprofen BID for sciatic and hip pain a day since January. She is prescribed tramadol for pain but states she is "wiped out" the next day so does not take it. She has also had several episodes of dark, tarry stools over the past several months.  Pt denies vomiting, fever, chills, gas, change in appetite, or trouble swallowing.  She has lost approx 6 lbs from being unable to eat due to sxs. Tomato based sauces, soda, and meat also seem to aggravate pts sxs.    Outpatient Encounter Prescriptions as of 12/05/2013  Medication Sig  . ACCU-CHEK AVIVA PLUS test strip CHECK 3 TIMES DAILY.  Marland Kitchen atorvastatin (LIPITOR) 40 MG tablet Take 1 tablet (40 mg total) by mouth daily.  Marland Kitchen buPROPion (WELLBUTRIN XL) 150 MG 24 hr tablet Take 1 tablet (150 mg total) by mouth daily.  . citalopram (CELEXA) 20 MG tablet Take 1.5 tablets (30 mg total) by mouth daily.  . hyoscyamine (LEVSIN SL) 0.125 MG SL tablet Place 1 tablet  (0.125 mg total) under the tongue every 4 (four) hours as needed for cramping.  . insulin aspart (NOVOLOG FLEXPEN) 100 UNIT/ML injection Inject 13 units 3 times a day, 15 min before a meal as instructed.  . Insulin Glargine (LANTUS SOLOSTAR) 100 UNIT/ML Solostar Pen Inject 40 Units into the skin daily at 10 pm.  . Insulin Pen Needle (NOVOFINE) 32G X 6 MM MISC 1 each by Other route daily.  . Lancets (ACCU-CHEK MULTICLIX) lancets Test blood sugar 3 times daily.  . metFORMIN (GLUCOPHAGE) 1000 MG tablet TAKE 1 TABLET (1,000 MG TOTAL) BY MOUTH 2 (TWO) TIMES DAILY WITH A MEAL.  Marland Kitchen ondansetron (ZOFRAN ODT) 4 MG disintegrating tablet Take 1 tablet (4 mg total) by mouth every 8 (eight) hours as needed for nausea.  . pantoprazole (PROTONIX) 40 MG tablet TAKE 1 TABLET (40 MG TOTAL) BY MOUTH DAILY.  . traMADol (ULTRAM) 50 MG tablet Take 0.5 tablets (25 mg total) by mouth at bedtime.    Allergies as of 12/05/2013 - Review Complete 12/05/2013  Allergen Reaction Noted  . Cymbalta [duloxetine hcl] Other (See Comments) 06/28/2012    Past Medical History  Diagnosis Date  . Diabetes mellitus without complication   . Hyperlipidemia   . Crohn disease   . Depression   . Chicken pox   . Depression   . Headache(784.0)  frequent  . Migraines   . Colon polyps   . Ulcerative proctitis   . GERD (gastroesophageal reflux disease)   . External hemorrhoids     Past Surgical History  Procedure Laterality Date  . Abdominal hysterectomy  1997  . Colon surgery  2006    partial colectomy  . Appendectomy  1997  . Tonsillectomy and adenoidectomy    . Cesarean section  1993, 1995  . Hernia repair  2006/2008    x2  . Colonoscopy  10/11/2012    Dr. Hilarie Velazquez    Review of systems: Positive for: nausea, diarrhea, abdominal pain, bloating, weight loss, and dark, tarry stools. All other ROS negative or as per HPI  Physical Exam: BP 102/68  Pulse 88  Ht 5\' 7"  (1.702 m)  Wt 230 lb 6 oz (104.497 kg)  BMI 36.07  kg/m2 Constitutional: WDWN NAD Eyes: anicteric Mouth: oral and posterior pharynx free of lesions Neck: supple, no mass or thyromegaly Lungs: clear to auscultation bilaterally Cardiovascular: S1S2 with regular rate and rhythm, no rubs murmurs or gallops noted. Abdomen: Diffusely tender, > in Epigastrium.  Multiple surgical scars. soft, nondistended, no masses or organomegaly, normal bowel sounds Extremities: no lower extremity edema  Skin: no rash Neuro: alert and oriented x 3 Psych: normal mood and affect  Data Reviewed: Previous pt records and EGD in 2012 (Unremarkable)  Ashley Velazquez, [place] 12/05/2013 9:19 AM   Agree with the above documentation including the assessment and plan as written by Ashley Velazquez Patient with epigastric abdominal pain, worse postprandially. Associated with nausea. Taking twice daily ibuprofen for hip and sciatica pain. Symptoms consistent with gastroduodenitis if not ulcer I recommended increasing pantoprazole 40 mg twice daily adding Carafate before meals and at bedtime and stopping NSAIDs. If symptoms fail to improve would recommend EGD Six-week followup with me or advanced practitioner for reassessment, however she is made aware she can call if symptoms are not improving and we would plan to schedule EGD directly No evidence for recurrent IBD,we reviewed colonoscopy and findings from last year

## 2013-12-09 ENCOUNTER — Telehealth: Payer: Self-pay | Admitting: *Deleted

## 2013-12-09 NOTE — Telephone Encounter (Signed)
Patient's pantoprazole has been approved from 12/05/13-12/06/14 as per [facility]. Reference # is [id number].

## 2013-12-13 NOTE — Telephone Encounter (Signed)
Quantity approval for pantoprazole good from 12/13/13-12/13/14. Reference #[id number]

## 2013-12-25 ENCOUNTER — Encounter: Payer: BC Managed Care – PPO | Admitting: Family Medicine

## 2014-01-07 ENCOUNTER — Telehealth: Payer: Self-pay | Admitting: Internal Medicine

## 2014-01-07 DIAGNOSIS — E1165 Type 2 diabetes mellitus with hyperglycemia: Principal | ICD-10-CM

## 2014-01-07 DIAGNOSIS — E1142 Type 2 diabetes mellitus with diabetic polyneuropathy: Secondary | ICD-10-CM

## 2014-01-07 DIAGNOSIS — IMO0002 Reserved for concepts with insufficient information to code with codable children: Secondary | ICD-10-CM

## 2014-01-07 MED ORDER — INSULIN ASPART 100 UNIT/ML ~~LOC~~ SOLN: SUBCUTANEOUS | Status: DC

## 2014-01-07 MED ORDER — INSULIN GLARGINE 100 UNIT/ML SOLOSTAR PEN: 40.0000 [IU] | PEN_INJECTOR | Freq: Every day | SUBCUTANEOUS | Status: DC

## 2014-01-07 MED ORDER — GLUCOSE BLOOD VI STRP: ORAL_STRIP | Status: AC

## 2014-01-07 MED ORDER — METFORMIN HCL 1000 MG PO TABS: 1000.0000 mg | ORAL_TABLET | Freq: Two times a day (BID) | ORAL | Status: AC

## 2014-01-07 MED ORDER — INSULIN PEN NEEDLE 32G X 6 MM MISC: Status: AC

## 2014-01-07 NOTE — Telephone Encounter (Signed)
Patient has requested refills on all meds, her husband insurance is about to lapse and want to get prescription filled before it do.  3 months supply of Lantus, metformin, Novalog, Accu strips and the needles.

## 2014-01-07 NOTE — Telephone Encounter (Signed)
Done

## 2014-01-09 ENCOUNTER — Ambulatory Visit: Payer: BC Managed Care – PPO | Admitting: Internal Medicine

## 2014-01-10 ENCOUNTER — Other Ambulatory Visit: Payer: Self-pay | Admitting: Internal Medicine

## 2014-01-15 ENCOUNTER — Telehealth: Payer: Self-pay | Admitting: Internal Medicine

## 2014-01-15 ENCOUNTER — Telehealth: Payer: Self-pay | Admitting: Family Medicine

## 2014-01-15 DIAGNOSIS — F32A Depression, unspecified: Secondary | ICD-10-CM

## 2014-01-15 DIAGNOSIS — F329 Major depressive disorder, single episode, unspecified: Secondary | ICD-10-CM

## 2014-01-15 MED ORDER — SUCRALFATE 1 G PO TABS: 1.0000 g | ORAL_TABLET | Freq: Three times a day (TID) | ORAL | Status: AC

## 2014-01-15 MED ORDER — PANTOPRAZOLE SODIUM 40 MG PO TBEC: 40.0000 mg | DELAYED_RELEASE_TABLET | Freq: Two times a day (BID) | ORAL | Status: AC

## 2014-01-15 NOTE — Telephone Encounter (Signed)
Ok to 60 days of both medications. She was supposed to follow up now and really needs to see a doctor right way in [place] to establish.

## 2014-01-15 NOTE — Telephone Encounter (Signed)
I left a message for the pt to return my call. 

## 2014-01-15 NOTE — Telephone Encounter (Signed)
I have spoken to patient and have advised that we will send 1 90 day script of pantoprazole and carafate to her pharmacy to get her through transition back to [place]. However, after that, she will need an appointment with Dr Hilarie Fredrickson for further refills from him or will need to see a GI in [place]. She verbalizes understanding.

## 2014-01-15 NOTE — Telephone Encounter (Signed)
Pt would like a 90 re-fill on citalopram (CELEXA) 20 MG tablet and buPROPion (WELLBUTRIN XL) 150 MG 24 hr tablet Pt is relocating to [place] and would like a 3 month re-fill on medication to give her time to re-establish with a different provider.  Pt is leaving 01/20/14.  Kristopher Oppenheim [place] [phone]

## 2014-01-16 ENCOUNTER — Ambulatory Visit: Payer: BC Managed Care – PPO | Admitting: Nurse Practitioner

## 2014-01-16 MED ORDER — CITALOPRAM HYDROBROMIDE 20 MG PO TABS: 30.0000 mg | ORAL_TABLET | Freq: Every day | ORAL | Status: AC

## 2014-01-16 MED ORDER — BUPROPION HCL ER (XL) 150 MG PO TB24
150.0000 mg | ORAL_TABLET | Freq: Every day | ORAL | Status: AC
Start: 2014-01-16 — End: ?

## 2014-01-16 NOTE — Telephone Encounter (Signed)
Rxs done and the pt is aware and stated she will see the physician in [place].

## 2014-01-30 ENCOUNTER — Encounter: Payer: BC Managed Care – PPO | Admitting: Family Medicine

## 2014-02-12 ENCOUNTER — Other Ambulatory Visit: Payer: Self-pay | Admitting: Internal Medicine

## 2014-09-03 IMAGING — CT CT ABD-PELV W/ CM
2 of 4 series · 17 of 46 positions shown, 19 images · IV contrast (Omnipaque 300)
Comparison: None.

CLINICAL DATA: History of Crohn's disease, abdominal pain over the
last month with nausea and diarrhea

CT ABDOMEN AND PELVIS WITH CONTRAST
TECHNIQUE: Multidetector CT imaging of the abdomen and pelvis was
performed following the standard protocol during bolus
administration of intravenous contrast.
Contrast: 100mL OMNIPAQUE IOHEXOL 300 MG/ML  SOLN

[Series 3: abd/ pel 5mm · axial · 0.79mm/px · z∈[-644,-180]mm · 14 of 101 slices shown, 16 images]
[im 4/101  soft-tissue]
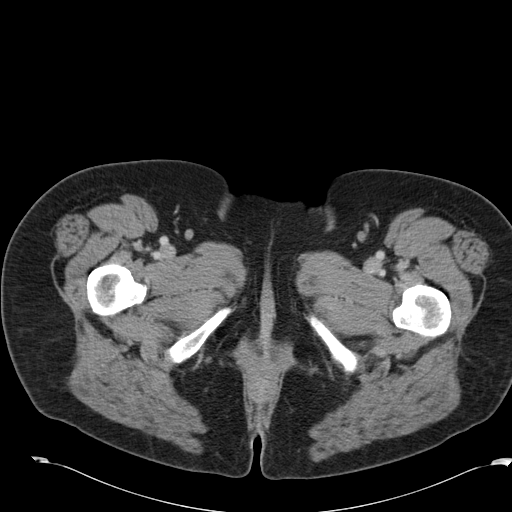
[im 4/101  bone]
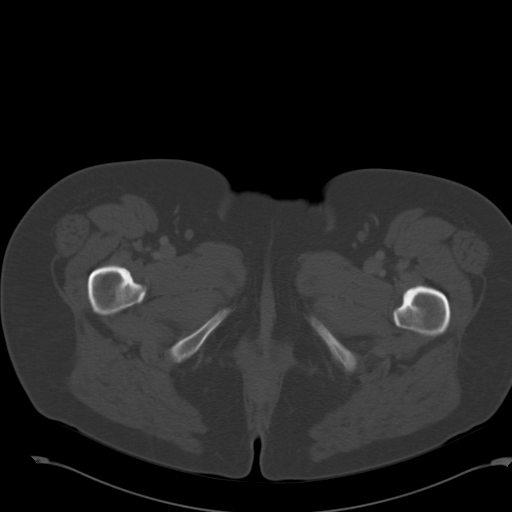
[im 12/101  soft-tissue]
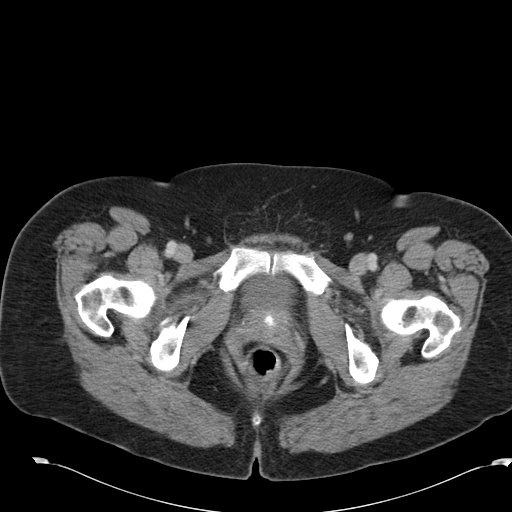
[im 20/101  soft-tissue]
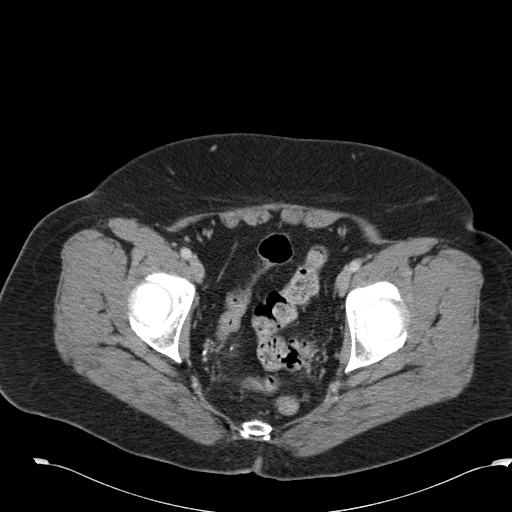
[im 27/101  soft-tissue]
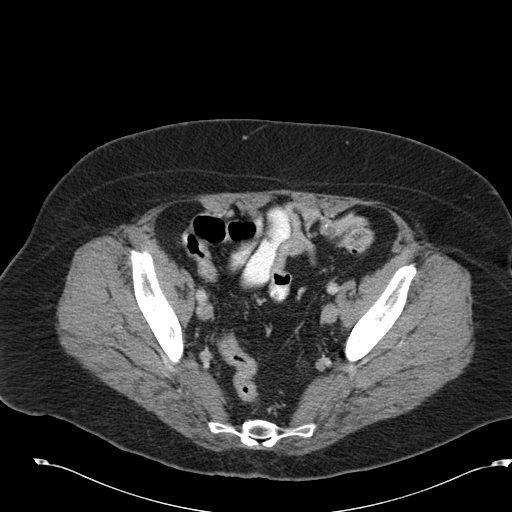
[im 35/101  soft-tissue]
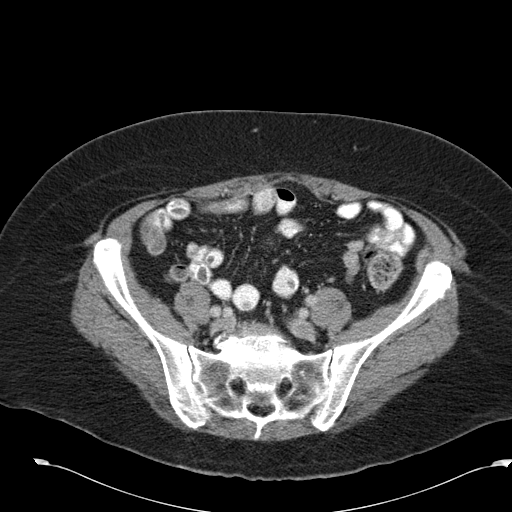
[im 39/101  soft-tissue]
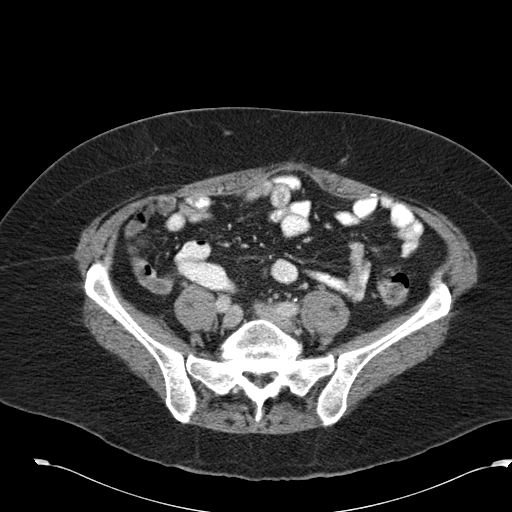
[im 47/101  soft-tissue]
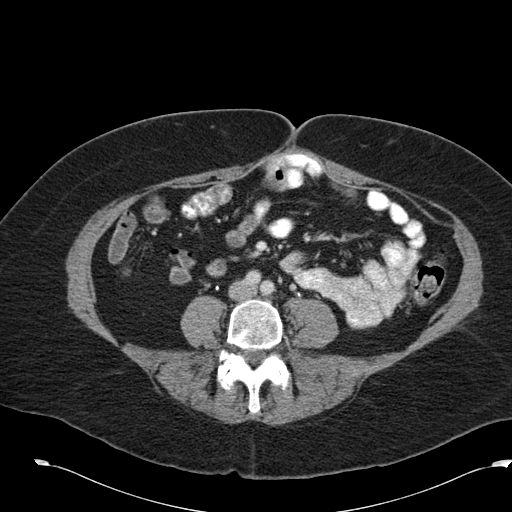
[im 54/101  soft-tissue]
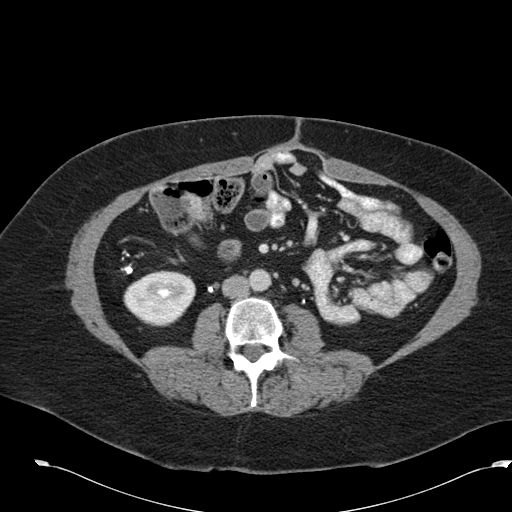
[im 62/101  soft-tissue]
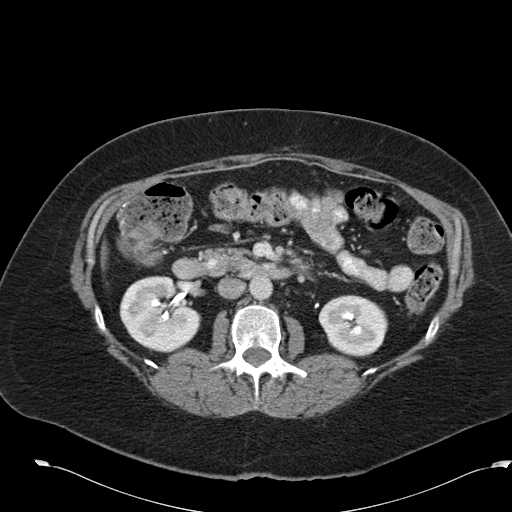
[im 62/101  bone]
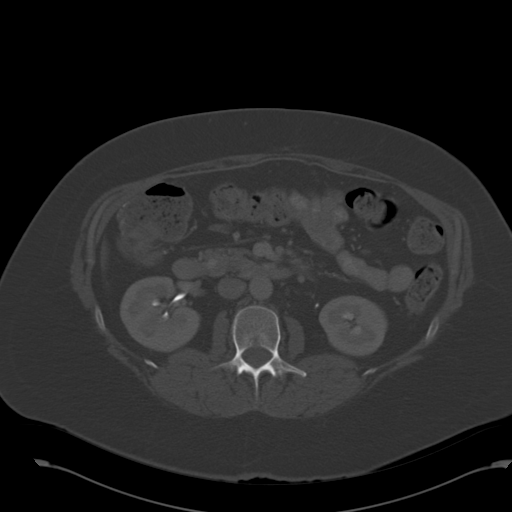
[im 66/101  soft-tissue]
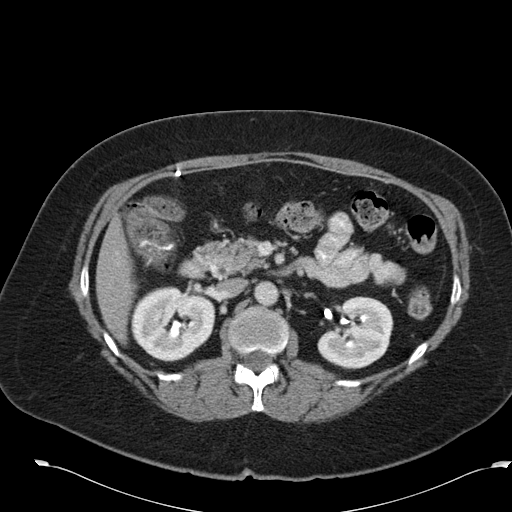
[im 74/101  soft-tissue]
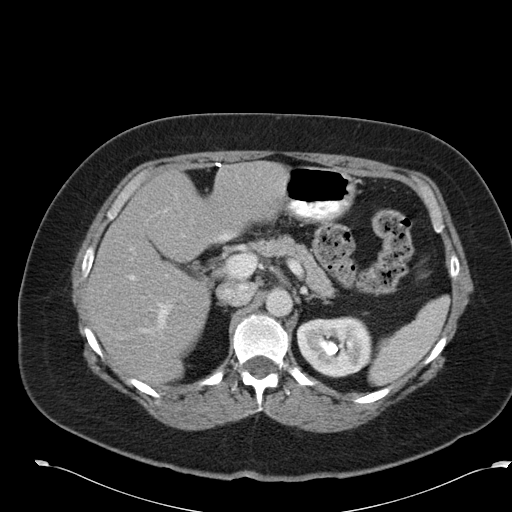
[im 81/101  soft-tissue]
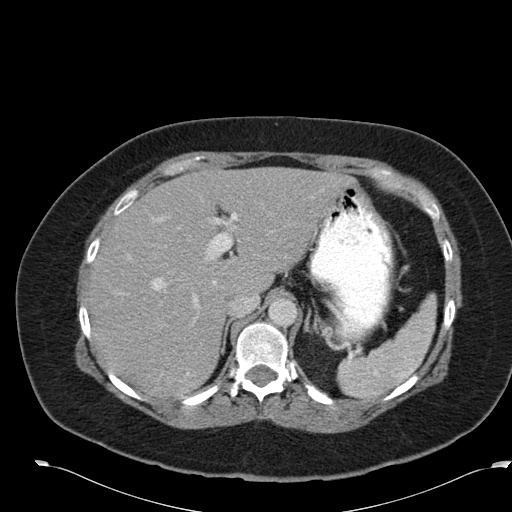
[im 89/101  soft-tissue]
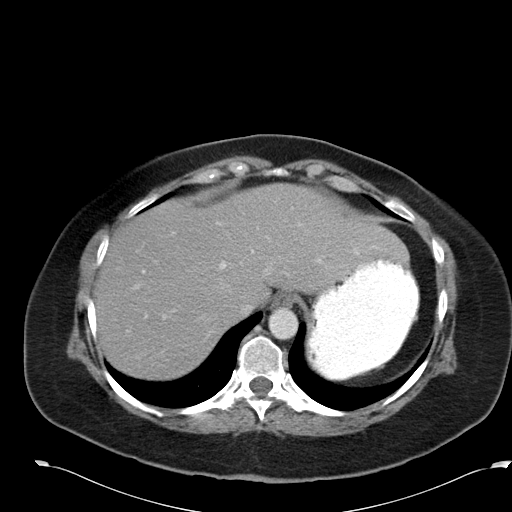
[im 97/101  soft-tissue]
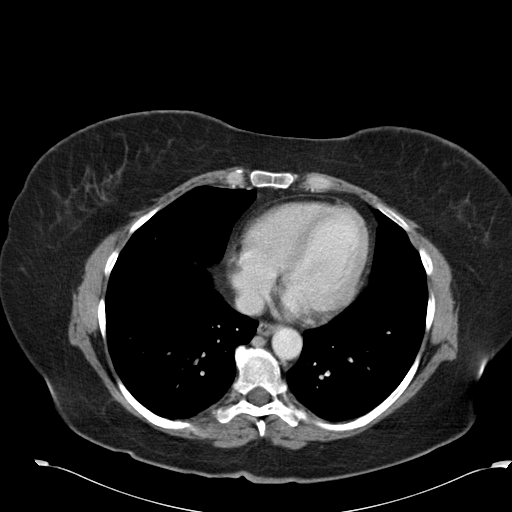

[Series 602: cor · coronal · 1.01mm/px · 3 of 117 slices shown]
[im 39/117  soft-tissue]
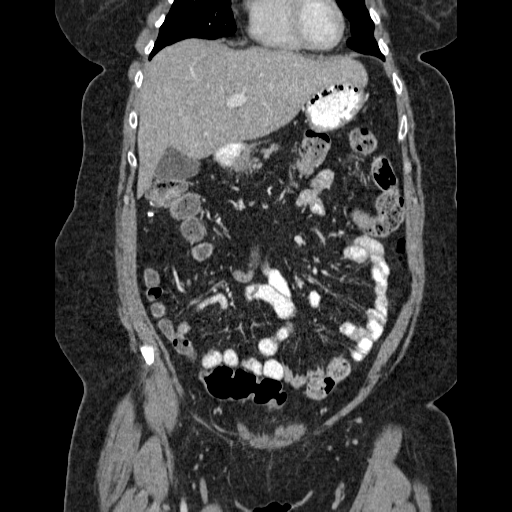
[im 52/117  soft-tissue]
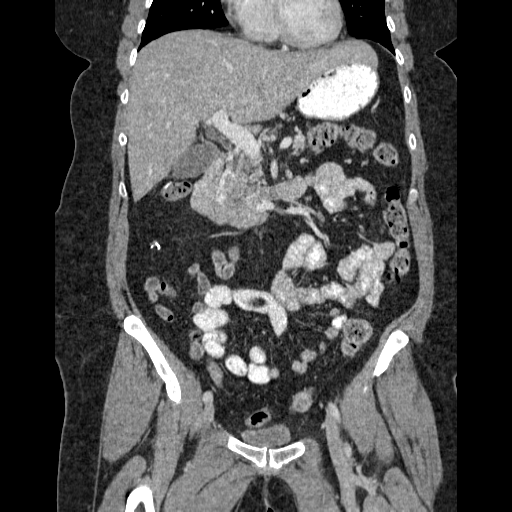
[im 65/117  soft-tissue]
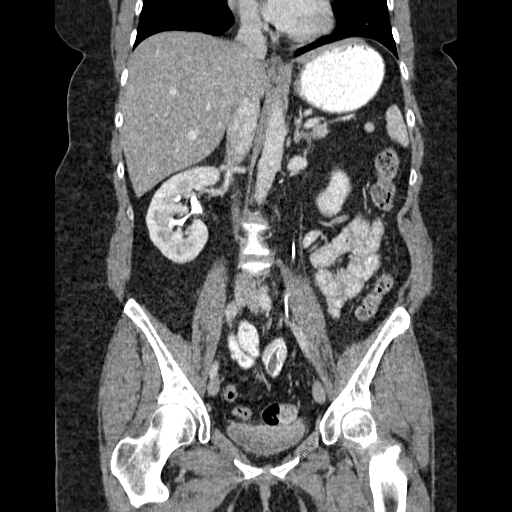

[17 of 46 positions shown; findings below may reference images not displayed]

FINDINGS: Lung bases clear.  Normal heart size.  No pericardial or
pleural effusion.  No hiatal hernia.

Abdomen:  Liver, gallbladder, biliary system, pancreas, spleen,
accessory splenule, adrenal glands, and kidneys are within normal
limits for age and demonstrate no acute process.

Negative for bowel obstruction, dilatation, ileus pattern, or free
air.  The patient is status post ventral hernia repair.  The
patient has also had a prior right colectomy.

No abdominal free fluid, fluid collection, hemorrhage, abscess, or
adenopathy.

No evidence of bowel wall thickening, acute inflammatory process,
or pneumatosis.  No mesenteric abnormality.

Pelvis:  Prior hysterectomy noted.  Urinary bladder unremarkable.
No pelvic free fluid, fluid collection, hemorrhage, abscess,
adenopathy, inguinal abnormality, or hernia.

Distal colon is redundant and under distended.  No distal bowel
abnormality within the pelvis.

Diffuse degenerative changes of the spine.  Vacuum disc phenomenon
at L3-4 and L5-L1.  No compression fracture.
IMPRESSION: No acute intra-abdominal or pelvic process.

Prior abdominal wall ventral hernia repair, partial right
colectomy, and hysterectomy.

No CT evidence of acute or active inflammatory bowel disease.

## 2014-09-08 IMAGING — CR DG HIP COMPLETE 2+V*R*
3 series · 3 of 3 positions shown · non-contrast
Comparison: None.

CLINICAL DATA: Right hip pain

RIGHT HIP - COMPLETE 2+ VIEW

[view not recorded (1 of 3)]
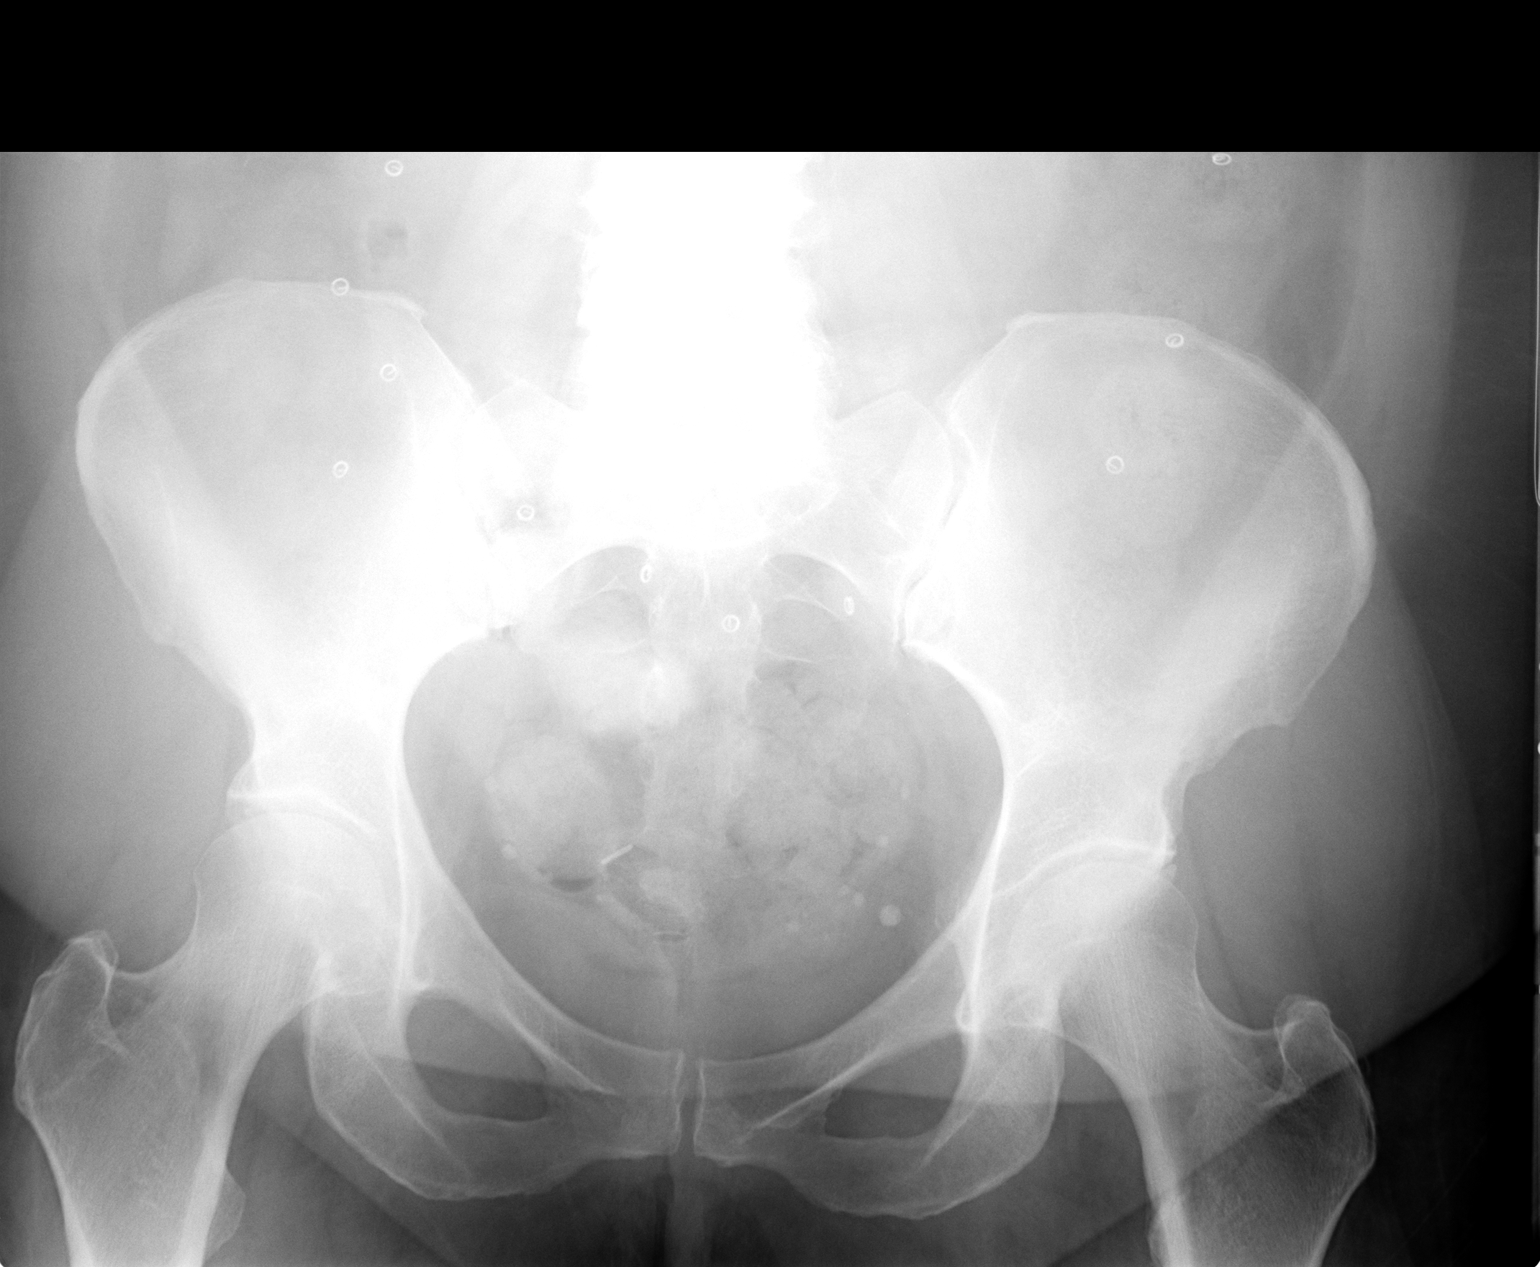

[view not recorded (2 of 3)]
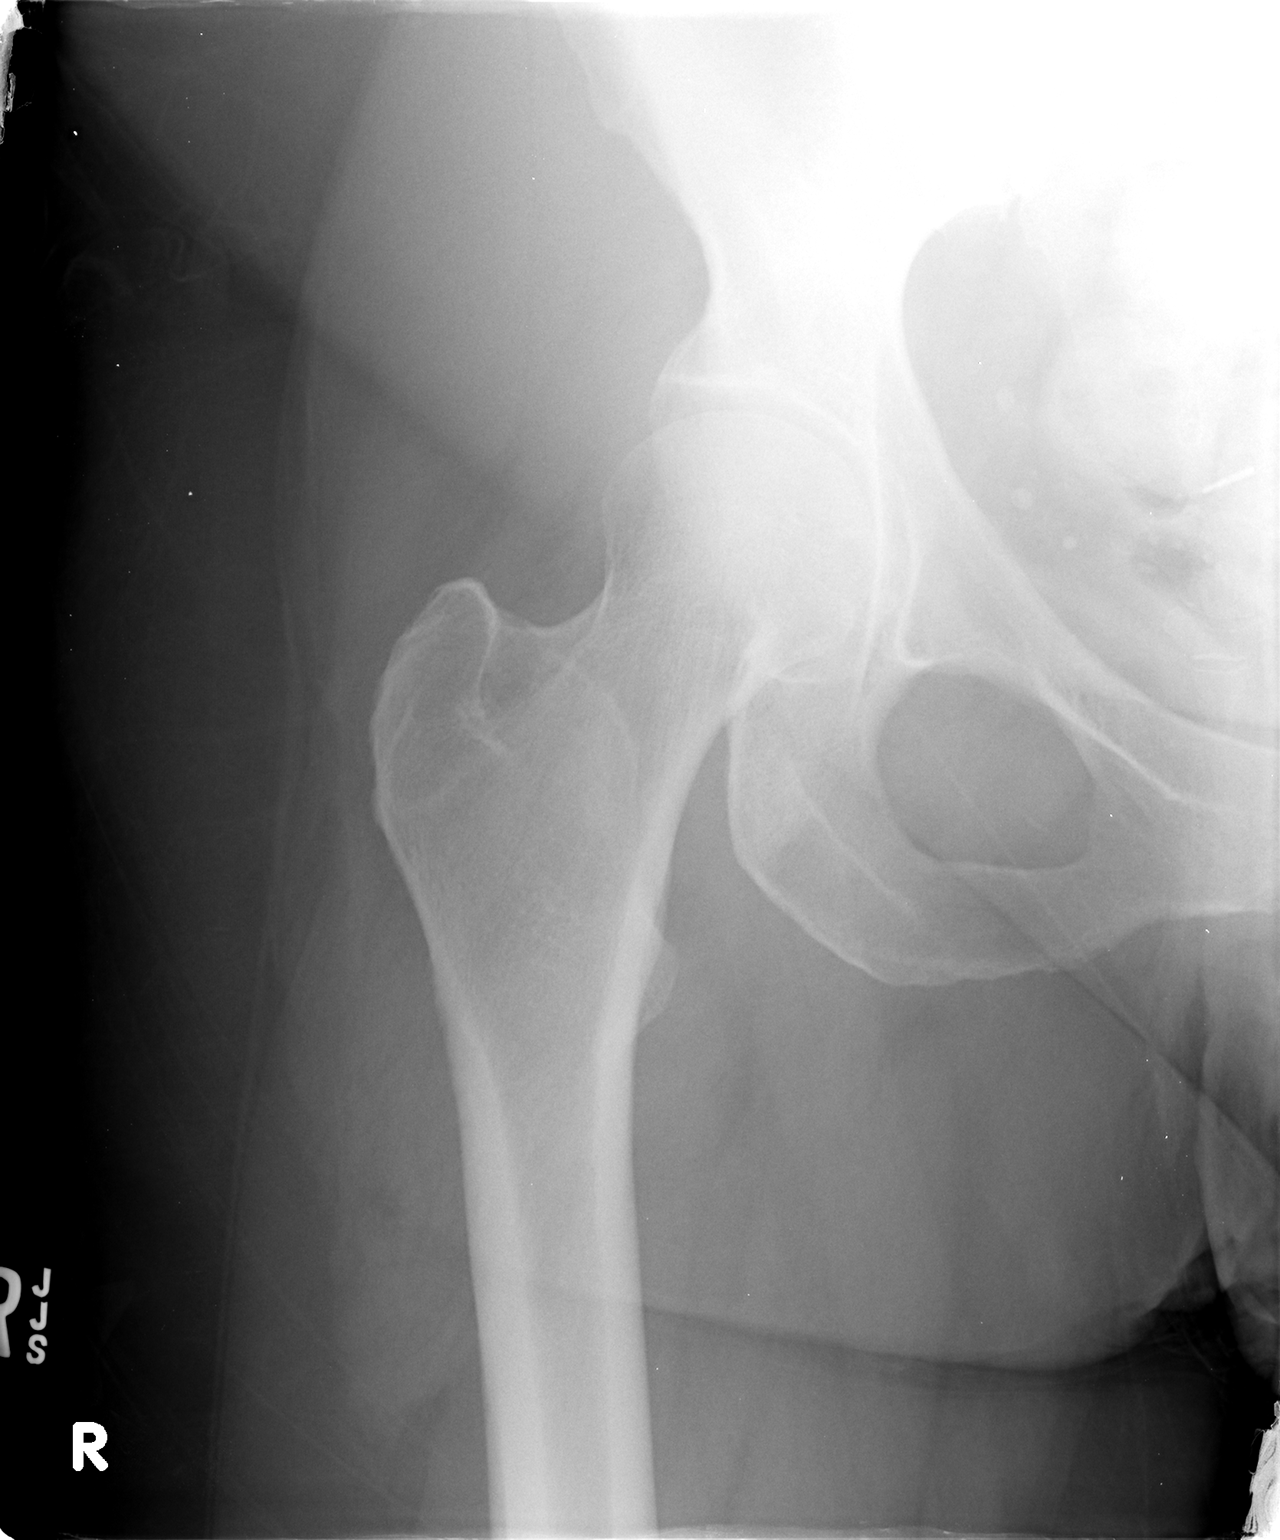

[view not recorded (3 of 3)]
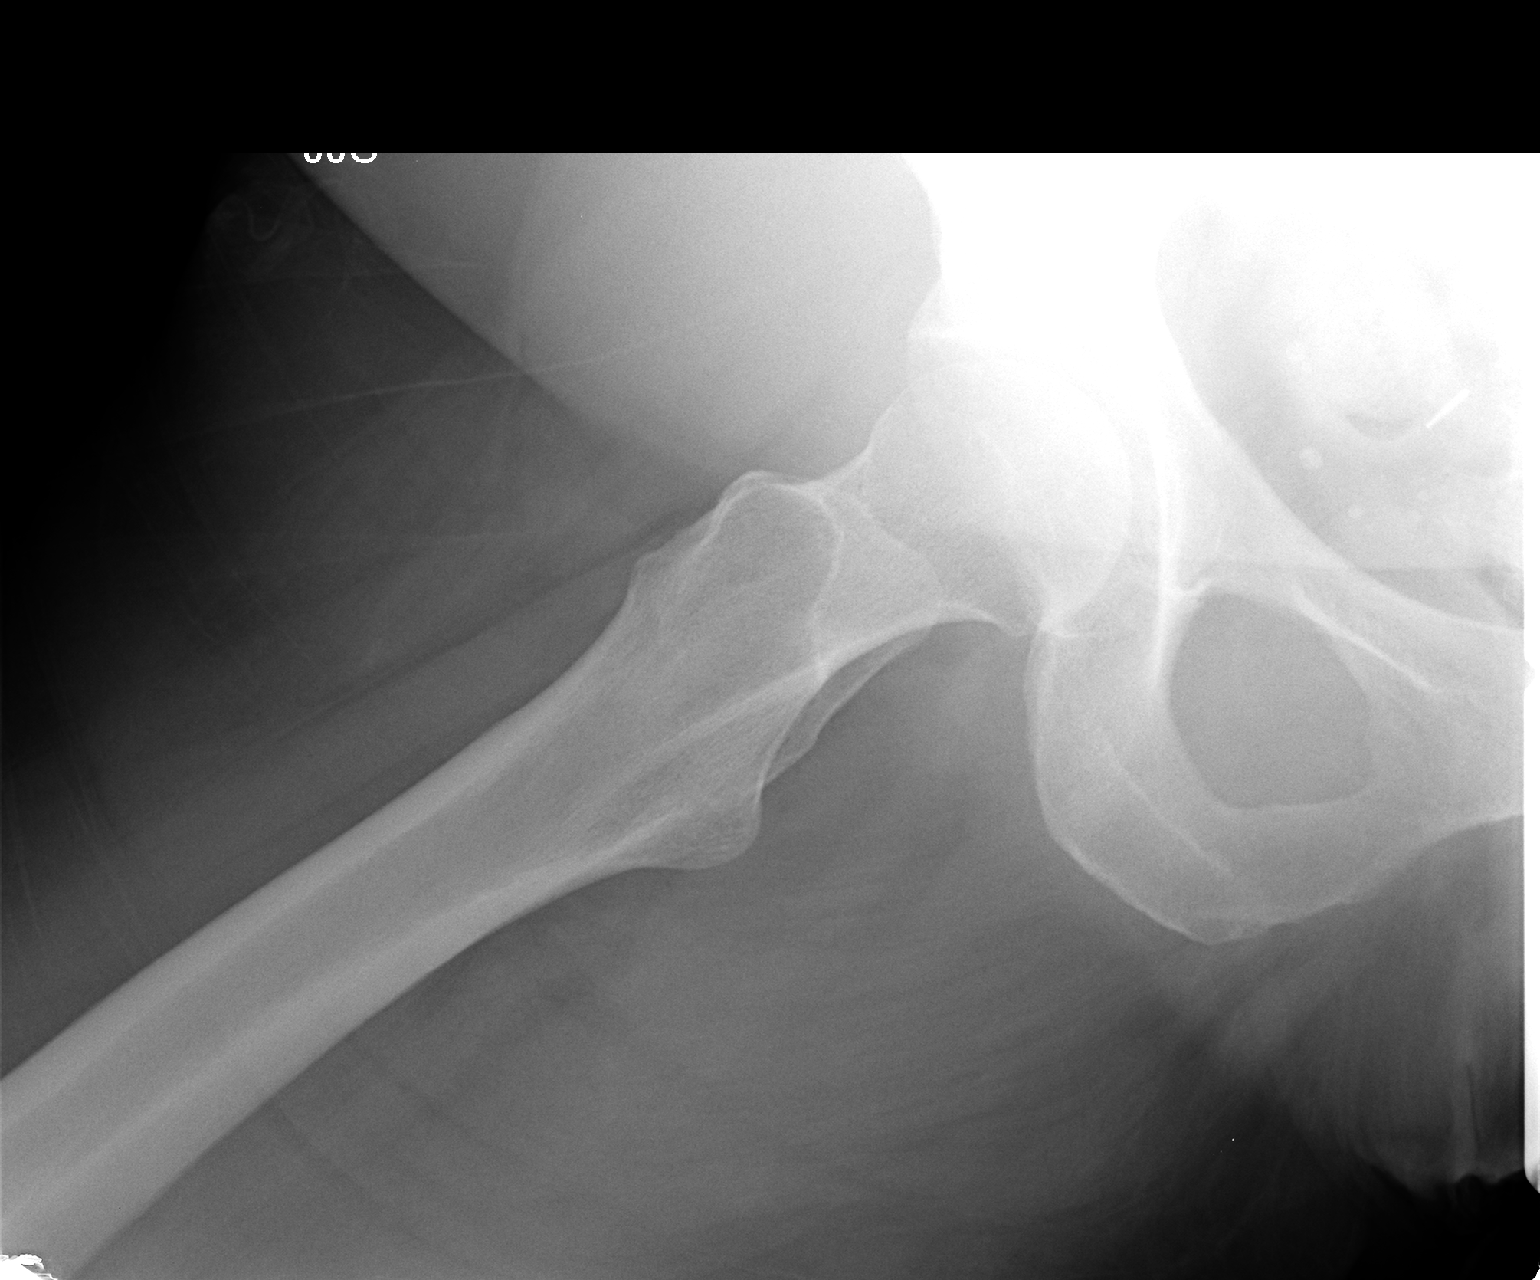

[3 of 3 positions shown; findings below may reference images not displayed]

FINDINGS: Three views of the right hip submitted.  No acute
fracture or subluxation.  Post hernia repair changes noted
abdominal wall.
IMPRESSION: No acute fracture or subluxation.

## 2014-09-12 ENCOUNTER — Encounter: Payer: Self-pay | Admitting: *Deleted

## 2016-10-28 ENCOUNTER — Ambulatory Visit: Payer: Medicare (Managed Care) | Admitting: Podiatry

## 2016-10-28 ENCOUNTER — Ambulatory Visit
Admission: RE | Admit: 2016-10-28 | Discharge: 2016-10-28 | Disposition: A | Discharge status: Home / Self Care | Payer: Medicare (Managed Care) | Source: Ambulatory Visit | Attending: Podiatry | Admitting: Podiatry

## 2016-10-28 ENCOUNTER — Encounter: Payer: Self-pay | Admitting: Podiatry

## 2016-10-28 VITALS — Ht 67.0 in | Wt 216.0 lb

## 2016-10-28 DIAGNOSIS — M79672 Pain in left foot: Secondary | ICD-10-CM

## 2016-10-28 DIAGNOSIS — M722 Plantar fascial fibromatosis: Secondary | ICD-10-CM

## 2016-10-28 MED ORDER — DICLOFENAC SODIUM 1 % EX GEL *I*
CUTANEOUS | 1 refills | Status: AC
Start: 2016-10-28 — End: ?

## 2016-10-28 NOTE — Progress Notes (Signed)
Chief Complaint   Patient presents with    New Patient Visit     left heel pain       HPI:  Betty LindenSandra Ray presents with a complaint of pain to the left heel.  Relates a gradual onset.  The pain started 6 weeks ago. She feels it may have started after being on vacation and walking in flip-flops extensively.  However there was no injury.  The severity of the pain is 8 /10 on verbal numeric pain scale.  The pain is described as dull and achy.  She went to urgent care and was recommended stretching and rest.    Treatments have included rest, stretch and ice .   The problem is alleviated by rest.   The problem is aggravated by first step after rest and increased activities .  She does now feel the pain is spreading away from the heel to some degree and more around the ankle.    Review of systems:  Musculoskeletal:  Denies hip or knee pain.  Positive fro right sided sciatica    Past medical history, Medications, Allergies, Social history, Surgical history, and Family history were personally reviewed and documented in the chart.    Exam:    Constitutional:  Appears well nourished/well developed.    Vascular:    Pedal pulses -  2 /4 dorsalis pedis pulse left foot.  2/4 posterior tibial pulse left      Edema- negative pretibial edema left.    Musculoskeletal: Left foot/ankle: Maximal tenderness to palpation at the insertion of the plantar fascia to the medial calcaneal tubercle.  Tenderness to palpation the Achilles tendon, posterior tibial tendon, and sinus tarsi left.  Hyperpronation subtalar joint left.  Ankle joint is restricted to dorsiflexion left with pain at end range dorsiflexion.  She has 5/5 muscle strength testing for plantar flexion, dorsiflexion, inversion, and eversion left foot/ankle.  Integument:      Inspection left foot - Skin inspection reveals no ulcers or rashes.  Skin is of normal quality, texture and turgor.  Intact without any signs of bacterial infection.       Palpation left foot  - No palpable  subcutaneous mass or nodule noted to the plantar foot.  No tightness of skin or induration noted.        Neurologic:      Light touch sensation is grossly intact to the left foot.  Negative Tinel's on percussion the posterior tibial nerve left.  Psychiatric:      AAOx3      Appears of normal mood and affect  Labs/Radiographs:     Assessment and Plan:    1. Left foot pain  * Foot LEFT standard AP, Lateral, Obliqu   2. Plantar fasciitis  diclofenac (VOLTAREN) 1 % gel       Maximal pain is associated with the plantar fascia.  However she also has pain that is associated with the posterior tibial tendon, Achilles tendon, bilateral subtalar joint/sinus tarsi.  She notes her pain is been worsening and therefore we have agreed on a mobilization to control subtalar joint pronation and strain on the plantar fascia and other related structures.  She was given a prescription for diclofenac gel to help with pain and inflammation.  Reviewed dosing and side effects.  She was fitted with a removable fracture walker.  She will use this for all weightbearing activity.  Discontinue should her pain increase or should there be any skin breakdown.  She will follow-up in approximately  4 weeks and consider transition into orthotics or possible injection.    [facility] (Select all that apply):      [address] [place], [place] [phone])      Frequency/Duration of use (Select all that apply):  []   (X) hours, (X) days per week  [x]   With ADLs  [x]   Until next follow-up appointment with physician  []   Other:      Physician note -   The above orthosis is required for the following reasons:   The patient is ambulatory but has weakness and instability of their left foot/ankle which requires stabilization from this rigid/semi-rigid orthosis to improve function.      [x]   Ambulatory patient  [x]   Weakness or deformity of the foot and ankle  [x]   Requires stabilization for medical reasons  [x]   Have the potential to benefit  functionally    Verbal and written instructions for the use and application of this item were given. The patient was instructed that should the brace result in increased pain, decreased sensation, increased swelling or an overall worsening of their medical condition, to please contact our office immediately.    Additional notes (Select all that apply):  []   Patient has has a same or similar brace in the past (X) years  []   Brace was lost or stolen  []   Patient gained or lost a significant amount of weight  []   Brace is broken beyond repair and no longer functional  []   Brace was destroyed in a fire or a natural disaster  []   Brace does not support the level of stability required by the injury  []   Other:      Additional comments:

## 2016-10-28 NOTE — Procedures (Signed)
Date: 10/28/16    [facility]  [facility]  [address]  [place] [id number]  Dept: [phone]    Number of Views  3 Views      Reason for the exam: Pain    Impression: Plantar calcaneal spur.  Normal calcaneal trabecular pattern.  No degenerative changes.  No fracture.  No subluxation or dislocation    Barnett ApplebaumMichael Chanee Henrickson, DPM

## 2016-10-28 NOTE — Patient Instructions (Signed)
Plantar Fasciitis Exercises    About this topic   Plantar fasciitis is swelling of the thick tissue on the bottom of the foot. This tissue is called the plantar fascia. It connects the heel bone to the toes and makes the arch of the foot. Exercise is an important part of making this problem better.  General   Before starting with a program, ask your doctor if you are healthy enough to do these exercises. Your doctor may have you work with a trainer or physical therapist to make a safe exercise program to meet your needs.  Stretching Exercises   Stretching exercises keep your muscles flexible. They also stop them from getting tight. Start by doing each of these stretches 2 to 3 times. In order for your body to make changes, you will need to hold these stretches for 20 to 30 seconds. Try to do the stretches 2 to 3 times each day. Do all exercises slowly.  · Calf stretches standing ? Stand about 12 to 18 inches away from a wall. Place your hands on the wall at shoulder level. Lean forward. Stretch your left leg straight behind you. Make sure the heel is flat on the floor and the knee straight. Now, lean forward and bend the knee of the right leg. Be sure that the heel does not come up. Hold 20 to 30 seconds. Now, bend your left knee forward until you feel a stretch in the back of the calf of your left leg. Hold 20 to 30 seconds. This will feel strange, but it is the best way to stretch this part of the calf muscle. Repeat on the other side.  · Calf stretches lying down with belt or towel ? Lie on your back with both legs straight. Loop a belt or towel around the ball of one foot. Pull on the belt or towel bending your foot towards you. Pull until you feel a good stretch in the back of your lower leg. Repeat on the other foot.  · Calf stair stretches ? Stand on a step and hold onto a rail. Position your feet so only the balls of your feet are on the step. Lower both heels until you feel a stretch in the back of  your calves. Do not do this stretch if you have trouble with balance.  · Crossed leg foot stretches ? Sit in a chair. Bend one leg up and rest the outside of the foot on the knee. Grab your foot and pull your toes and foot forward until you feel a stretch along the bottom of your foot. Repeat with the other foot.  Strengthening Exercises   Strengthening exercises keep your muscles firm and strong. Start by repeating each exercise 2 to 3 times. Work up to doing each exercise 10 times. Try to do the exercises 2 to 3 times each day. Do all exercises slowly.  · Towel pick-ups ? Sit in a chair with your feet flat on the floor. Make sure you do not have shoes or socks on your feet. Place a towel under one foot with one end lined up with your heel. Keep your heel on the floor and try grabbing the towel with your toes. Gradually work it back until there is no more towel to move. If this is too easy, add a weight to the end of the towel. Now try pushing the towel back out while keeping your heel still.  · Rolling foot ? Use a golf ball, tennis   ball, soup can, a frozen water bottle, or a rolling pin for this exercise. Sit in a sturdy chair. Put the ball, can, or rolling pin underneath your sore foot. Push down firmly and roll it back and forth with your foot for 1 to 2 minutes. Work up to 3 to 4 minutes. If this exercise is too easy, try doing it while standing up with more pressure on the foot.         What will the results be?   · Less pain  · Less pulling  · Less swelling  · Better flexibility and range of motion  · Easier to walk and do other activities  Helpful tips   · Stay active and work out to keep your muscles strong and flexible.  · Keep a healthy weight to avoid putting too much stress on your joints. Eat a healthy diet to keep your muscles healthy.  · Be sure you do not hold your breath when exercising. This can raise your blood pressure. If you tend to hold your breath, try counting out loud when exercising. If  any exercise bothers you, stop right away.  · Always warm up before stretching. Heated muscles stretch much easier than cool muscles. Stretching cool muscles can lead to injury.  · Try walking or cycling at an easy pace for a few minutes to warm up your muscles. Do this again after exercising.  · Never bounce when doing stretches.  · Doing exercises before a meal may be a good way to get into a routine.  · After exercising, it is a good idea to ice the bottom of your foot. A good way to do this is by sitting in a chair and rolling a frozen water bottle back and forth under your foot. Do not do this longer than 15 to 20 minutes.  · Exercise may be slightly uncomfortable, but you should not have sharp pains. If you do get sharp pains, stop what you are doing. If the sharp pains continue, call your doctor.  Where can I learn more?   American Academy of Orthopaedic Surgeons  http://orthoinfo.aaos.org/topic.cfm?topic=a00149   American Family Physician  http://www.aafp.org/afp/2001/0201/p477.html   Last Reviewed Date   2013-05-30  Consumer Information Use and Disclaimer   This information is not specific medical advice and does not replace information you receive from your health care provider. This is only a brief summary of general information. It does NOT include all information about conditions, illnesses, injuries, tests, procedures, treatments, therapies, discharge instructions or life-style choices that may apply to you. You must talk with your health care provider for complete information about your health and treatment options. This information should not be used to decide whether or not to accept your health care provider’s advice, instructions or recommendations. Only your health care provider has the knowledge and training to provide advice that is right for you.  Copyright   Copyright © 2015 [employer] and its affiliates and/or licensors. All rights reserved.

## 2016-11-28 ENCOUNTER — Ambulatory Visit: Payer: Medicare (Managed Care) | Admitting: Podiatry

## 2016-12-06 ENCOUNTER — Encounter: Payer: Self-pay | Admitting: Podiatry

## 2016-12-06 ENCOUNTER — Ambulatory Visit: Payer: Medicare (Managed Care) | Attending: Podiatry | Admitting: Podiatry

## 2016-12-06 VITALS — Ht 67.0 in | Wt 216.0 lb

## 2016-12-06 DIAGNOSIS — M722 Plantar fascial fibromatosis: Secondary | ICD-10-CM

## 2016-12-06 MED ORDER — LIDOCAINE HCL 2 % IJ SOLN *I*
1.0000 mL | Freq: Once | INTRAMUSCULAR | Status: AC
Start: 2016-12-06 — End: 2016-12-06
  Administered 2016-12-06: 1 mL via SUBCUTANEOUS

## 2016-12-06 MED ORDER — DEXAMETHASONE SODIUM PHOSPHATE 10 MG/ML IJ SOLN *I*
5.0000 mg | Freq: Once | INTRAMUSCULAR | Status: AC
Start: 2016-12-06 — End: 2016-12-06
  Administered 2016-12-06: 5 mg via INTRA_ARTICULAR

## 2016-12-06 MED ORDER — METHYLPREDNISOLONE ACETATE 40 MG/ML IJ SUSP *I*
20.0000 mg | Freq: Once | INTRAMUSCULAR | Status: AC
Start: 2016-12-06 — End: 2016-12-06
  Administered 2016-12-06: 20 mg via INTRAMUSCULAR

## 2016-12-06 NOTE — Progress Notes (Signed)
Chief Complaint   Patient presents with    Foot Pain       HPI:  Presents today for follow-up of left foot plantar fasciitis.  She had pain started approximately 12 weeks ago which payment secondary to wearing flip-flops on vacation.  She has been treated with rest, stretching, and ice.  On last visit she was dispensed a fracture walker which she has been wearing for about 6 weeks.  However she relates relatively minimal improvement.  She continues to have pain with ambulation even in the fracture walker.  Primarily pain is to the heel but occasionally have shooting pain to the toes.        Exam:    Constitutional:  Appears well nourished/well developed.   Vascular:    Pedal pulses -  2 /4 dorsalis pedis pulse left foot.  2/4 posterior tibial pulse left      Edema- negative pretibial edema left.    Musculoskeletal: Tenderness to palpation at the origin of the plantar fascial to the medial calcaneal tubercle.  No tenderness to palpation the Achilles tendon, posterior tibial tendon, peroneal tendon, or sinus tarsi left.  Hyperpronation subtalar joint left.  5/5 muscle strength testing for plantarflexion and dorsiflexion left.  Integument:      Inspection left foot - Skin inspection reveals no ulcers or rashes.  Skin is of normal quality, texture and turgor.  Intact without any signs of bacterial infection.       Palpation left foot  - No palpable subcutaneous mass or nodule noted to the plantar foot.  No tightness of skin or induration noted.        Neurologic:      Light touch sensation is grossly intact to the left foot.  Negative Tinel's on percussion the posterior tibial nerve left.  Psychiatric:      AAOx3      Appears of normal mood and affect   Labs/Radiographs:     Assessment and Plan:    1. Plantar fasciitis         Exam is consistent with plantar fascial pain.  Although she does describe some shooting pains in question whether there may be an underlying neurologic component of pain.  Since she is unimproved  in the fracture walker we have agreed on injection and should she not respond favorably to injection may consider nerve conduction studies.  She will have an injection today and try to wean out of the fracture walker.  Follow into orthotics and continue with stretching.    Risks of injection were reviewed to include infection, soft tissue atrophy, and tendon rupture.  Verbal consent was obtained.  Preprocedural timeout was taken.  Procedure: Plantar fascial injection left foot    Left plantar heel was prepped with Betadine and the plantar fascial orgin to the medial calcaneus was injected with 1 cc of 2% lidocaine plain, 20 mg of Depo-Medrol, and 5 mg of dexamethasone phosphate from a plantar medial approach. Patient tolerated procedure well.  Discussed aftercare and possible side effects. Contact office with any concerns      She will contact office next week with her response to injection.

## 2017-01-12 ENCOUNTER — Encounter: Payer: Self-pay | Admitting: Family Medicine

## 2017-05-04 ENCOUNTER — Encounter: Payer: Self-pay | Admitting: Family Medicine

## 2017-06-15 LAB — UNMAPPED LAB RESULTS
Basophil # (HT): 0.1 10 3/uL — NL (ref 0.0–0.2)
Basophil % (HT): 1.1 % — NL (ref 0.0–1.8)
Eosinophil # (HT): 0.3 10 3/uL — NL (ref 0.0–0.5)
Eosinophil % (HT): 4.5 % — NL (ref 0.0–7.0)
Hematocrit (HT): 39.6 % — NL (ref 34.0–47.0)
Hemoglobin (HGB) (HT): 13.3 g/dL — NL (ref 11.5–16.0)
Lymphocyte # (HT): 2.9 10 3/uL — NL (ref 0.9–3.8)
Lymphocyte % (HT): 41.4 % — NL (ref 17.0–44.0)
MCHC (HT): 33.6 g/dL — NL (ref 32.0–36.0)
MCV (HT): 84.6 FL — NL (ref 81.0–99.0)
Mean Corpuscular Hemoglobin (MCH) (HT): 28.4 pg — NL (ref 26.0–34.0)
Monocyte # (HT): 0.5 10 3/uL — NL (ref 0.2–1.0)
Monocyte % (HT): 7.6 % — NL (ref 4.0–12.0)
Neutrophil # (HT): 3.2 10 3/uL — NL (ref 1.5–7.7)
Platelets (HT): 223 10 3/uL — NL (ref 140–400)
RBC (HT): 4.68 10 6/uL — NL (ref 3.80–5.20)
RDW (HT): 12.1 % — NL (ref 11.5–15.0)
Seg Neut % (HT): 45.4 % — NL (ref 40.0–75.0)
WBC (HT): 7 10 3/uL — NL (ref 4.0–10.8)

## 2017-07-11 ENCOUNTER — Inpatient Hospital Stay: Admit: 2017-07-11 | Discharge: 2017-07-11 | Disposition: A | Discharge status: Home / Self Care | Payer: Self-pay

## 2017-08-11 LAB — UNMAPPED LAB RESULTS
Basophil # (HT): 0.1 10 3/uL — NL (ref 0.0–0.2)
Basophil % (HT): 1 % — NL (ref 0–2)
Eosinophil # (HT): 0.2 10 3/uL — NL (ref 0.0–0.5)
Eosinophil % (HT): 3 % — NL (ref 0–7)
Hematocrit (HT): 41 % — NL (ref 40–52)
Hemoglobin (HGB) (HT): 14.2 g/dL — NL (ref 11.5–16.0)
Lymphocyte # (HT): 3.1 10 3/uL — NL (ref 0.9–3.8)
Lymphocyte % (HT): 41 % — NL (ref 17–44)
MCHC (HT): 35 g/dL — NL (ref 32.0–36.0)
MCV (HT): 83 fL — NL (ref 81–99)
Mean Corpuscular Hemoglobin (MCH) (HT): 29 pg — NL (ref 26.0–34.0)
Monocyte # (HT): 0.5 10 3/uL — NL (ref 0.2–1.0)
Monocyte % (HT): 7 % — NL (ref 4–15)
Neutrophil # (HT): 3.6 10 3/uL — NL (ref 1.5–7.7)
Platelets (HT): 190 10 3/uL — NL (ref 140–400)
RBC (HT): 4.89 10 6/uL — NL (ref 3.80–5.20)
RDW (HT): 12.5 % — NL (ref 11.5–15.0)
Seg Neut % (HT): 49 % — NL (ref 43–75)
WBC (HT): 7.5 10 3/uL — NL (ref 4.0–10.0)

## 2017-10-06 ENCOUNTER — Inpatient Hospital Stay: Admit: 2017-10-06 | Discharge: 2017-10-06 | Disposition: A | Discharge status: Home / Self Care | Payer: Self-pay

## 2017-10-06 LAB — UNMAPPED LAB RESULTS
Basophil # (HT): 0.1 10 3/uL — NL (ref 0.0–0.2)
Basophil % (HT): 1 % — NL (ref 0–2)
Eosinophil # (HT): 0.3 10 3/uL — NL (ref 0.0–0.5)
Eosinophil % (HT): 5 % — NL (ref 0–7)
Hematocrit (HT): 43 % — NL (ref 40–52)
Hemoglobin (HGB) (HT): 14.4 g/dL — NL (ref 11.5–16.0)
Lymphocyte # (HT): 2.5 10 3/uL — NL (ref 0.9–3.8)
Lymphocyte % (HT): 39 % — NL (ref 17–44)
MCHC (HT): 33.9 g/dL — NL (ref 32.0–36.0)
MCV (HT): 84 fL — NL (ref 81–99)
Mean Corpuscular Hemoglobin (MCH) (HT): 28.5 pg — NL (ref 26.0–34.0)
Monocyte # (HT): 0.4 10 3/uL — NL (ref 0.2–1.0)
Monocyte % (HT): 6 % — NL (ref 4–15)
Neutrophil # (HT): 3 10 3/uL — NL (ref 1.5–7.7)
Platelets (HT): 206 10 3/uL — NL (ref 140–400)
RBC (HT): 5.06 10 6/uL — NL (ref 3.80–5.20)
RDW (HT): 12.7 % — NL (ref 11.5–15.0)
Seg Neut % (HT): 49 % — NL (ref 43–75)
WBC (HT): 6.3 10 3/uL — NL (ref 4.0–10.0)

## 2017-10-08 LAB — UNMAPPED LAB RESULTS
Hematocrit (HT): 40 % — NL (ref 40–52)
Hemoglobin (HGB) (HT): 13.3 g/dL — NL (ref 11.5–16.0)
MCHC (HT): 33 g/dL — NL (ref 32.0–36.0)
MCV (HT): 86 fL — NL (ref 81–99)
Mean Corpuscular Hemoglobin (MCH) (HT): 28.5 pg — NL (ref 26.0–34.0)
Platelets (HT): 200 10 3/uL — NL (ref 140–400)
RBC (HT): 4.67 10 6/uL — NL (ref 3.80–5.20)
RDW (HT): 12.9 % — NL (ref 11.5–15.0)
WBC (HT): 7.7 10 3/uL — NL (ref 4.0–10.0)

## 2017-10-09 LAB — UNMAPPED LAB RESULTS
Hematocrit (HT): 40 % — NL (ref 40–52)
Hematocrit (HT): 40 % — NL (ref 40–52)
Hemoglobin (HGB) (HT): 13.1 g/dL — NL (ref 11.5–16.0)
Hemoglobin (HGB) (HT): 13.4 g/dL — NL (ref 11.5–16.0)
MCHC (HT): 32.5 g/dL — NL (ref 32.0–36.0)
MCV (HT): 86 fL — NL (ref 81–99)
Mean Corpuscular Hemoglobin (MCH) (HT): 28.1 pg — NL (ref 26.0–34.0)
Platelets (HT): 184 10 3/uL — NL (ref 140–400)
RBC (HT): 4.67 10 6/uL — NL (ref 3.80–5.20)
RDW (HT): 12.9 % — NL (ref 11.5–15.0)
WBC (HT): 5.6 10 3/uL — NL (ref 4.0–10.0)

## 2017-10-10 LAB — UNMAPPED LAB RESULTS
Hematocrit (HT): 40 % — NL (ref 40–52)
Hemoglobin (HGB) (HT): 12.8 g/dL — NL (ref 11.5–16.0)
MCHC (HT): 32.2 g/dL — NL (ref 32.0–36.0)
MCV (HT): 87 fL — NL (ref 81–99)
Mean Corpuscular Hemoglobin (MCH) (HT): 27.9 pg — NL (ref 26.0–34.0)
Platelets (HT): 187 10 3/uL — NL (ref 140–400)
RBC (HT): 4.59 10 6/uL — NL (ref 3.80–5.20)
RDW (HT): 12.9 % — NL (ref 11.5–15.0)
WBC (HT): 5.2 10 3/uL — NL (ref 4.0–10.0)

## 2017-12-18 ENCOUNTER — Inpatient Hospital Stay: Admit: 2017-12-18 | Discharge: 2017-12-18 | Disposition: A | Discharge status: Home / Self Care | Payer: Self-pay

## 2017-12-19 LAB — UNMAPPED LAB RESULTS
Hematocrit (HT): 37.6 % — NL (ref 34.0–47.0)
Hemoglobin (HGB) (HT): 12.6 g/dL — NL (ref 11.5–16.0)
MCHC (HT): 33.5 g/dL — NL (ref 32.0–36.0)
MCV (HT): 83.7 FL — NL (ref 81.0–99.0)
Mean Corpuscular Hemoglobin (MCH) (HT): 28.1 pg — NL (ref 26.0–34.0)
Platelets (HT): 243 10 3/uL — NL (ref 140–400)
RBC (HT): 4.49 10 6/uL — NL (ref 3.80–5.20)
RDW (HT): 12.3 % — NL (ref 11.5–15.0)
WBC (HT): 5.5 10 3/uL — NL (ref 4.0–10.8)

## 2018-01-18 LAB — UNMAPPED LAB RESULTS
Basophil # (HT): 0.1 10 3/uL — NL (ref 0.0–0.2)
Basophil % (HT): 2 % — NL (ref 0–2)
Eosinophil # (HT): 0.6 10 3/uL — ABNORMAL HIGH (ref 0.0–0.5)
Eosinophil % (HT): 10 % — ABNORMAL HIGH (ref 0–7)
Hematocrit (HT): 40 % — NL (ref 40–52)
Hemoglobin (HGB) (HT): 13.5 g/dL — NL (ref 11.5–16.0)
Lymphocyte # (HT): 2.7 10 3/uL — NL (ref 0.9–3.8)
Lymphocyte % (HT): 44 % — NL (ref 17–44)
MCHC (HT): 33.6 g/dL — NL (ref 32.0–36.0)
MCV (HT): 85 fL — NL (ref 81–99)
Mean Corpuscular Hemoglobin (MCH) (HT): 28.5 pg — NL (ref 26.0–34.0)
Monocyte # (HT): 0.6 10 3/uL — NL (ref 0.2–1.0)
Monocyte % (HT): 9 % — NL (ref 4–15)
Neutrophil # (HT): 2.2 10 3/uL — NL (ref 1.5–7.7)
Platelets (HT): 237 10 3/uL — NL (ref 140–400)
RBC (HT): 4.74 10 6/uL — NL (ref 3.80–5.20)
RDW (HT): 13 % — NL (ref 11.5–15.0)
Seg Neut % (HT): 36 % — ABNORMAL LOW (ref 43–75)
WBC (HT): 6.2 10 3/uL — NL (ref 4.0–10.0)

## 2018-03-27 ENCOUNTER — Inpatient Hospital Stay: Admit: 2018-03-27 | Discharge: 2018-03-27 | Disposition: A | Discharge status: Home / Self Care | Payer: Self-pay

## 2018-05-03 ENCOUNTER — Inpatient Hospital Stay: Admit: 2018-05-03 | Discharge: 2018-05-03 | Disposition: A | Discharge status: Home / Self Care | Payer: Self-pay

## 2018-05-28 ENCOUNTER — Telehealth: Payer: Self-pay | Admitting: Pulmonology

## 2018-05-28 ENCOUNTER — Encounter: Payer: Self-pay | Admitting: Gastroenterology

## 2018-05-28 NOTE — Telephone Encounter (Signed)
Copied from CRM [id number]. Topic: Appointments - Schedule Appointment  >> May 28, 2018  1:33 PM Benay Spice wrote:  Request for a new patient consultation for: SOB and small lesion right lung (Patient currently has a pulmonologist, but wants to switch to [place] for second opinion)    Is this patient aware of referral? YES/NO: yes    Referring provider, phone and fax, person you spoke with: Allayne Gitelman PA with Dr.Melanie Rockwell [place], Phone: [phone], Fax: [phone]  Current Pulmonologist: Dr.Margaret Perley Jain at [facility], Phone: [phone], Fax: [phone]  Spoke to patient.    Is referral in eRecord (internal) or being faxed (external)? External, will be faxed   - If external, were records and imaging requested? YES/NO: yes   - Where was imaging done? CT Scan in 04/11/18 at [place] and [place], VQ Scan on either 04/23/18 or 04/24/18 at [place] [facility] [place], Chest X-rays done a couple weeks ago at [facility]      Does the patient have insurance? YES/NO: yes   - If no insurance, was [facility] notified to update insurance ?  YES/NO: yes    Date/Time of appointment: 07/23/18 [facility] at 9:00am and Dr.Everett Hale Bogus at 9:30am

## 2018-05-29 ENCOUNTER — Other Ambulatory Visit: Payer: Self-pay

## 2018-07-19 ENCOUNTER — Telehealth: Payer: Self-pay

## 2018-07-19 NOTE — Telephone Encounter (Signed)
LM for Betty Ray her 3.30.20 appointment has been PP due to Covid 19 concern. We will call her back in the near future with an appointment

## 2018-07-23 ENCOUNTER — Ambulatory Visit: Payer: Medicare (Managed Care) | Admitting: Pulmonology

## 2018-07-23 ENCOUNTER — Other Ambulatory Visit: Payer: Medicare (Managed Care)

## 2018-09-19 NOTE — Telephone Encounter (Signed)
Confirmed w [name] In Person appointment with Dr. Lequita Halt

## 2018-09-27 ENCOUNTER — Encounter: Payer: Self-pay | Admitting: Gastroenterology

## 2018-09-27 ENCOUNTER — Ambulatory Visit: Payer: Medicare (Managed Care) | Admitting: Pulmonology

## 2018-09-27 ENCOUNTER — Encounter: Payer: Self-pay | Admitting: Pulmonology

## 2018-09-27 VITALS — BP 136/72 | HR 92 | Temp 97.8°F | Ht 67.0 in | Wt 226.0 lb

## 2018-09-27 DIAGNOSIS — R911 Solitary pulmonary nodule: Secondary | ICD-10-CM

## 2018-09-27 DIAGNOSIS — G4733 Obstructive sleep apnea (adult) (pediatric): Secondary | ICD-10-CM

## 2018-09-27 MED ORDER — AUTO-TITRATING CPAP MACHINE *A*
0 refills | Status: DC
Start: 2018-09-27 — End: 2019-04-04

## 2018-09-27 NOTE — Patient Instructions (Signed)
·   Have your CT scan in September to follow up on the nodule   I am going to speak with my pulmonary hypertension colleagues about whether a repeat study of your right heart is indicated   We will get moving on treating your sleep apnea, either repeating the test (if needed) or starting you on CPAP.

## 2018-09-27 NOTE — Progress Notes (Signed)
INITIAL Ray CONSULTATION    Dear Dr. Nelda Ray,    I had the pleasure of seeing Betty Ray in consultation/second opinion today.  Please allow me to review the details  for my records.    As you know, Betty Ray is a 58 y.o. woman with Crohn's Disease (on no immunosuppressants at present), poorly controlled type 2 diabetes, obesity, anxiety/depression, GER and recent diagnosis of mild OSA on home sleep test who presents for second opinion regarding activity-limiting exertional dyspnea. This occurs with climbing inclines/stairs, carrying anything greater than 10 lbs, or trying to walk quickly. She is able to walk around her block once or twice but has to stop and rest at least a few times. These symptoms started about a year ago and have persisted. She did have an episode about 10 years ago when she had similar symptoms, but they resolved after a few months. She did use an inhaler at that time.    She has been followed by [facility] at [facility]. A complete and thorough battery of tests were done, not all of which I have access to. From the sound of it, however, PFT's and methacholine challenge did not reveal an etiology for the dyspnea; neither did CPET testing (I don't have these results). She had a stress test and a left and right heart catherization; no definitive etiology was disocvered. The right sided pressures were only very minimally elevated (PCWP 11, PA 16/6, RV 22/4, RA 7/4). She comes today unsatisfied that the only solution offered for her symptoms was weight loss, as she states that her symptoms began when she was considerably lighter than she is now (about 180 lbs vs. 226 lbs today).    During the course of the above evaluation, she was found to have a subsegmental Ray emoblism in June 2019 in the RLL; this had resolved by the time she had a CT in September. She has also been followed for a 1.3 X 10 mm nodule in the right middle love with some central cavitation; this  has been unchanged.    She has not had new symptoms since the previous evaluation. She did have a HST in December that showed mild OSA (AHI 7.9). The lowest oxygen saturation was 85% but the majority of the night her O2 sat was above 90%.       Social History:  Patient lives with her husband, her adult children, her daughter-in-law, three dogs, five cats, and two birds. The latter two have been adopted in the last three months. There ewas no bird in the home prior to that.   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 There is no family history of VTE.     A complete medication list was reviewed and updated with the patient.    Pertinent Ray medications/treatments include: warfarin, protonix, prozac, wellbutrin, and lipitor.     Allergies:   Allergies   Allergen Reactions    Cymbalta [Duloxetine Hcl] Palpitations    No Known Drug Allergy      Created by Conversion - 0;     No Known Latex Allergy      Created by Conversion - 0;        A full 14-point ROS was performed and can be referenced in the patient questionnaire, which I personally reviewed  with the patient. Relevant findings include those mentioned above. There is no fevers, chills, or weight loss. No hemoptysis. No palpitations, symptoms to suggest congestive heart failure, or syncope. No symptoms to suggest underlying rheumatologic disease.     Physical Exam:  Blood pressure 136/72, pulse 92, temperature 36.6 C (97.8 F), temperature source Temporal, height 1.702 m ( ), weight 102.5 kg (226 lb), SpO2 98 %.on RA   Gen:   Alert, pleasant, in no distress and with non-labored breathing  HEENT: Anicteric, oropharynx clear, without thrush. Neck circumference is 17.5 inches.   Neck:  No cervical or supraclavicular lymphadenopathy. No thyromegaly. No stridor or  tracheal deviation.    Resp:  Breath sounds are full. Lungs are clear, no wheezes, rales, or rhonchi. There are no abnormalities to percussion.    CV:    Regular S1S2,  No murmur,   No significant pretibial edema. No evident JVD.   GI:  Abdominal exam without evidence of acute pathology.     Msk: No cyanosis, no clubbing, no joint tenderness.    Neuro: Alert and oriented X 3 without focal findings on limited neurologic exam.   Skin: No rashes on limited skin exam.       Ray function tests:  I can find PFT results in Care Everywhere from Betty Ray note, dated 11/2017. This shows normal function. DLCO was 69.2% predicted.     Imaging:  Chest CT: Images were personally reviewed/interpreted: Notable for lack of scarring, except for the aformentioned RML nodule (1.3 by 1.0 cm). PA does not appear overtly enlarged.     Other pertinent tests: See above. I spent more than 45 minutes sorting through records from [facility].     Active Problem List:  Patient Active Problem List   Diagnosis Code    Obstructive Sleep Apnea G47.33    Diastolic Dysfunction I51.9    Crohn's Disease K50.90       Assessment:  Betty Ray is a 58 y.o. woman with Crohn's disease, likely metabolic syndrome, obesity and a stable RML lung nodule of unclear etiology who presents for second opinion regarding exertional dyspnea. She also has a history of a PE discovered about one year ago.    Betty Ray has had an exhaustive work-up for underlying etiologies to explain her symptoms. I do note that she had mild OSA detected on a home sleep test, which may very well under-predict the degree to which she has apnea. This can produce greater-than-expected daytime dyspnea, generally through effects on the right heart. Although her right heart catheterization was not impressive, I do wonder if she has exercise-related Ray hypertension that is more severe than predicted by this resting study.    Impression/Recommendations:    She is agreeable to starting  CPAP. Her neck circumference is 17.5 inches, and she has a positive Epworth (10). I will order auto-titrating CPAP and related equipment for her to use.   I will consult with my PH colleagues to get a sense of whether they feel a PH specific assessmment would be of use. 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    I told Virgilene that I would be in touch after speaking with the [facility] group here.     Thank you for allowing me to become involved in the care of your patient. Please don't  hesitate to contact me with any questions or concerns.     Sincerely,     Koleen Distance, MD  Associate Professor of Medicine  Division of Ray & Critical Care  [place]    10:15 AM  09/27/2018

## 2018-09-28 ENCOUNTER — Telehealth: Payer: Self-pay

## 2018-09-28 NOTE — Telephone Encounter (Signed)
Called to ask pt to call and request a copy of her sleep study be sent to use from [facility]. [facility] would not fax a copy to [place] due to [employer] guidelines. Pt agreeable and will call [facility].

## 2018-10-01 ENCOUNTER — Telehealth: Payer: Self-pay

## 2018-10-01 NOTE — Telephone Encounter (Signed)
Ms. Carbonell is scheduled for an appointment on:    Date: 9/21   Time: 10:00  Provider: Julious Oka Imaging CT scan    Patient confirmed appointment.

## 2018-10-12 ENCOUNTER — Other Ambulatory Visit: Payer: Self-pay | Admitting: Pulmonology

## 2018-10-12 DIAGNOSIS — R911 Solitary pulmonary nodule: Secondary | ICD-10-CM

## 2018-11-06 LAB — UNMAPPED LAB RESULTS
ABO RH Blood Type (HT): O NEG — NL
Antibody Screen (HT): NEGATIVE — NL
Basophil # (HT): 0.1 10 3/uL — NL (ref 0.0–0.2)
Basophil % (HT): 1 % — NL (ref 0–3)
Eosinophil # (HT): 0.4 10 3/uL — NL (ref 0.0–0.6)
Eosinophil % (HT): 5 % — NL (ref 0–5)
Hematocrit (HT): 37 % — NL (ref 35–47)
Hemoglobin (HGB) (HT): 12.1 g/dL — NL (ref 12.0–16.0)
Lymphocyte # (HT): 2.6 10 3/uL — NL (ref 1.0–4.8)
Lymphocyte % (HT): 29 % — NL (ref 15–45)
MCHC (HT): 33 g/dL — NL (ref 31.0–37.5)
MCV (HT): 83 fL — NL (ref 80–100)
Mean Corpuscular Hemoglobin (MCH) (HT): 27.4 pg — NL (ref 26.0–34.0)
Monocyte # (HT): 0.6 10 3/uL — NL (ref 0.1–1.0)
Monocyte % (HT): 7 % — NL (ref 0–15)
Neutrophil # (HT): 5.1 10 3/uL — NL (ref 1.8–8.0)
Platelets (HT): 226 10 3/uL — NL (ref 150–450)
RBC (HT): 4.42 10 6/uL — NL (ref 3.80–5.20)
RDW (HT): 13 % — NL (ref 0.0–15.2)
Seg Neut % (HT): 58 % — NL (ref 45–75)
WBC (HT): 8.8 10 3/uL — NL (ref 4.0–11.0)

## 2018-11-07 LAB — UNMAPPED LAB RESULTS
Basophil # (HT): 0.1 10 3/uL — NL (ref 0.0–0.2)
Basophil % (HT): 1 % — NL (ref 0–3)
Eosinophil # (HT): 0.5 10 3/uL — NL (ref 0.0–0.6)
Eosinophil % (HT): 6 % — ABNORMAL HIGH (ref 0–5)
Hematocrit (HT): 39 % — NL (ref 35–47)
Hemoglobin (HGB) (HT): 12.1 g/dL — NL (ref 12.0–16.0)
Lymphocyte # (HT): 2.3 10 3/uL — NL (ref 1.0–4.8)
Lymphocyte % (HT): 32 % — NL (ref 15–45)
MCHC (HT): 31.1 g/dL — NL (ref 31.0–37.5)
MCV (HT): 88 fL — NL (ref 80–100)
Mean Corpuscular Hemoglobin (MCH) (HT): 27.3 pg — NL (ref 26.0–34.0)
Monocyte # (HT): 0.7 10 3/uL — NL (ref 0.1–1.0)
Monocyte % (HT): 10 % — NL (ref 0–15)
Neutrophil # (HT): 3.6 10 3/uL — NL (ref 1.8–8.0)
Platelets (HT): 215 10 3/uL — NL (ref 150–450)
RBC (HT): 4.44 10 6/uL — NL (ref 3.80–5.20)
RDW (HT): 13.2 % — NL (ref 0.0–15.2)
Seg Neut % (HT): 51 % — NL (ref 45–75)
WBC (HT): 7.1 10 3/uL — NL (ref 4.0–11.0)

## 2018-12-03 ENCOUNTER — Encounter: Payer: Self-pay | Admitting: Pulmonology

## 2018-12-11 LAB — UNMAPPED LAB RESULTS
Hematocrit (HT): 37.2 % — NL (ref 34.0–47.0)
Hemoglobin (HGB) (HT): 12.1 g/dL — NL (ref 11.5–16.0)
MCHC (HT): 32.5 g/dL — NL (ref 32.0–36.0)
MCV (HT): 84.2 FL — NL (ref 81.0–99.0)
Mean Corpuscular Hemoglobin (MCH) (HT): 27.4 pg — NL (ref 26.0–34.0)
Platelets (HT): 239 10 3/uL — NL (ref 140–400)
RBC (HT): 4.42 10 6/uL — NL (ref 3.80–5.20)
RDW (HT): 12.9 % — NL (ref 11.5–15.0)
WBC (HT): 7.1 10 3/uL — NL (ref 4.0–10.8)

## 2018-12-28 ENCOUNTER — Telehealth: Payer: Self-pay | Admitting: Pulmonology

## 2018-12-28 DIAGNOSIS — G4733 Obstructive sleep apnea (adult) (pediatric): Secondary | ICD-10-CM

## 2018-12-28 MED ORDER — NON-SYSTEM MEDICATION *A*
1 refills | Status: DC
Start: 2018-12-28 — End: 2019-10-08

## 2018-12-28 NOTE — Telephone Encounter (Signed)
Copied from [place] [id number]. Topic: Access to Care - Labs/Orders/Imaging  >> Dec 28, 2018  1:46 PM Mosie Epstein D wrote:  PATIENT is calling to request an order be submitted for CPAP PRESCRIPTION to be face mask. The order can be submitted to [phone].     Patient can be reached with questions at [phone].    Marland Kitchen

## 2018-12-28 NOTE — Telephone Encounter (Signed)
Order's written for CPAP supplies.

## 2019-01-10 ENCOUNTER — Encounter: Payer: Self-pay | Admitting: Pulmonology

## 2019-01-14 ENCOUNTER — Ambulatory Visit: Payer: Medicare (Managed Care) | Admitting: Radiology

## 2019-01-23 LAB — UNMAPPED LAB RESULTS
Basophil # (HT): 0.1 10 3/uL — NL (ref 0.0–0.2)
Basophil % (HT): 1.2 % — NL (ref 0.0–1.8)
Eosinophil # (HT): 0.5 10 3/uL — NL (ref 0.0–0.5)
Eosinophil % (HT): 6.2 % — NL (ref 0.0–7.0)
Hematocrit (HT): 39 % — NL (ref 34.0–47.0)
Hemoglobin (HGB) (HT): 13 g/dL — NL (ref 11.5–16.0)
Lymphocyte # (HT): 3 10 3/uL — NL (ref 0.9–3.8)
Lymphocyte % (HT): 37.5 % — NL (ref 17.0–44.0)
MCHC (HT): 33.3 g/dL — NL (ref 32.0–36.0)
MCV (HT): 83 FL — NL (ref 81.0–99.0)
Mean Corpuscular Hemoglobin (MCH) (HT): 27.7 pg — NL (ref 26.0–34.0)
Monocyte # (HT): 0.5 10 3/uL — NL (ref 0.2–1.0)
Monocyte % (HT): 6.5 % — NL (ref 4.0–12.0)
Neutrophil # (HT): 3.9 10 3/uL — NL (ref 1.5–7.7)
Platelets (HT): 284 10 3/uL — NL (ref 140–400)
RBC (HT): 4.7 10 6/uL — NL (ref 3.80–5.20)
RDW (HT): 12.5 % — NL (ref 11.5–15.0)
Seg Neut % (HT): 48.6 % — NL (ref 40.0–75.0)
WBC (HT): 8 10 3/uL — NL (ref 4.0–10.8)

## 2019-03-11 ENCOUNTER — Encounter: Payer: Self-pay | Admitting: Pulmonology

## 2019-03-12 ENCOUNTER — Encounter: Payer: Self-pay | Admitting: Gastroenterology

## 2019-03-28 NOTE — Telephone Encounter (Signed)
Patient called on 11/20 and was advised CPAP must be returned when it is d/t non-compliance. Pt to return machine and when patient has office visit on 12/10 new script can be written to set back up with CPAP.

## 2019-04-04 ENCOUNTER — Encounter: Payer: Self-pay | Admitting: Pulmonary Disease

## 2019-04-04 ENCOUNTER — Ambulatory Visit: Payer: Medicare (Managed Care) | Admitting: Pulmonology

## 2019-04-04 ENCOUNTER — Ambulatory Visit: Payer: Medicare (Managed Care) | Admitting: Pulmonary Disease

## 2019-04-04 DIAGNOSIS — R911 Solitary pulmonary nodule: Secondary | ICD-10-CM

## 2019-04-04 DIAGNOSIS — R0609 Other forms of dyspnea: Secondary | ICD-10-CM

## 2019-04-04 DIAGNOSIS — G4733 Obstructive sleep apnea (adult) (pediatric): Secondary | ICD-10-CM

## 2019-04-04 DIAGNOSIS — R0600 Dyspnea, unspecified: Secondary | ICD-10-CM

## 2019-04-04 MED ORDER — AUTO-TITRATING CPAP MACHINE *A*
0 refills | Status: DC
Start: 2019-04-04 — End: 2019-10-08

## 2019-04-04 NOTE — Patient Instructions (Addendum)
Get flu shot for the season.  Will repeat chest CT in 12 months from the last one (July 2021).   Will order CPAP for you.  Follow up in 7 months after the chest CT with Dr. Lilia Pro.

## 2019-04-04 NOTE — Progress Notes (Signed)
[facility] Follow-up Note:  Telemedicine Appointment    Telephone Visit: Due to the COVID 19  Pandemic Health Crisis    This is an established patient visit.    Reason for visit: Follow up    HPI:  I spoke to Betty Ray for a telehome follow up visit for shortness of breath.  She was last seen September 27, 2018 with Dr. Lequita Halt.  At her last visit she was agreeable to starting CPAP.  Her sleep study done in December showed mild obstructive sleep apnea with an uAHI of 7.9.  She contacted our office in November stating that her CPAP had to be returned due to noncompliance.  She said that when she originally received the CPAP and started to use it she said that she was choking on it.  She said that she called [facility] in August due to using nasal pillows with a head band and she felt like she was choking since she normally sleeps with her mouth open.  She requested a full face mask and she had to talk to several people and it took until [place] for her to get the full mask.  She said once she got the full face mask she was able to use the face mask approximately 6 hours a night and was able to use every night for 2 weeks until she got the letter regarding the non-compliance.  She said that once she started using it she said that she felt she slept more soundly and was able to sleep through the night instead of waking up multiple times which she is now doing again.  She also felt rested when she woke up.       She has been seeing Dr. Fransisco Beau, cardiology and had a 24-hour Holter monitor.  She said they think she has postural orthostatic tachycardia syndrome (POTS) and it looks like she will be seeing  Dr. Donzetta Sprung.  It is felt that this is contributing to her shortness of breath.  She said when she gets up she gets dizzy and has passed out.  She said she recently fell in the bathroom after passing out.  She did with any bending over she will have symptoms with any change in position she has symptoms.  She  stopped driving in May because of what is been going on.          She has not needed to be on oral steroids since her last visit or required antibiotics for upper respiratory infection.      Cough:   Daily   Mucus production:  Thick yellowish color.     Hemoptysis: Denies   Shortness of breath: She does not really notice the shortness of breath at rest but will notice the palpitations.  She said any exertional activity no matter what she is doing she notices the shortness of breath and rapid heart rate.  She said she is not sure which occurs first.  She is not able to carry anything more than 5 pounds because of the shortness of breath and then will start to feel dizzy.  She said she only showers when there is someone at home.  Chest tightness: Less than it was but comes and goes.   Chest pain: Denies   Wheezing: A little and comes and goes.   Nocturnal symptoms: Stopped when she started using the CPAP but now is occurring 3-4 times a night and she is more restless.   Fevers: Denies  Sinus:     Drainage:  Denies       PND:  Yes and tends to occur more in the winter.  She does have sinus congestion.  She takes Flonase 2 squirts each nostril twice a day.   Allegra once a day and Sudafed 4 times a day.   She does have sneezing fits and itchy eyes.        Heartburn: Denies and denies any acid taste backing up into her mouth.  She takes  pantoprazole 40 mg twice a day before breakfast and supper.  She said for a while it was 4 times a day.  She is followed by a gastroenterologist.  Appetite: Stable   Unwanted Weight loss:  Denies    Urgent Care Visits: Denies r/t breathing issues since the last visit.   ED Visits / Hospitalizations: Denies r/t breathing issues since the last visit.     A ROS was performed and pertinent findings include those mentioned above.     Patient Active Problem List   Diagnosis Code    Obstructive Sleep Apnea G47.33    Diastolic Dysfunction I51.9    Crohn's Disease K50.90    Exertional  dyspnea R06.00      Patient's problem list, allergies, and medications were reviewed and updated as appropriate. Please see the EHR for full details.    Current Outpatient Medications   Medication    warfarin (COUMADIN) 4 MG tablet    pantoprazole (PROTONIX) 40 MG EC tablet    FLUoxetine (PROZAC) 20 MG capsule    atorvastatin (LIPITOR) 20 MG tablet    Non-System Medication    auto-titrating CPAP (AUTOSET) machine    diclofenac (VOLTAREN) 1 % gel    ACCU-CHEK AVIVA test strip    NOVOFINE 32G X 6 MM MISC    Accu-Chek Multiclix Lancets lancets    dicyclomine (BENTYL) 20 MG tablet     No current facility-administered medications for this visit.      Pulmonary Medications: She used to have an albuterol inhaler but has not had one in about a year and it did not help.  She is not on any inhalers.      Physical Exam:    This visit was performed during a pandemic event, thus the physical exam was not performed.    She had a normal voice.  Did not sound short of breath.  No audible wheezing.  Mood and affect appear normal.    Results:  Indication: Dyspnea, chronic.     Comparison: CT chest 01/18/2018.    Technique: Chest CT was performed using 1.25 mm collimation from the lung apices through the lung bases after the uneventful administration of 80 mL of Omnipaque 350. Coronal and sagittal reformats were obtained.    Findings:    Lungs and pleura: There is a 1.3 x 0.9 cm right middle lobe nodule along the mediastinal pleura with some central cavitation (5-146), similar to prior. There is mild biapical scarring, similar to prior. There is no pleural effusion. There is no  pneumothorax.  Airway: The central airways appear patent.   Esophagus: The esophagus is unremarkable.  Heart: The heart is normal size without pericardial effusion.   Vasculature: Study limited due to poor contrast bolus timing. No main or lobar pulmonary embolus.The main pulmonary artery is normal in caliber. The thoracic aorta is normal in course  and caliber.  Lymph nodes: No enlarged lymph nodes by CT size criteria in the mediastinum, hilar and axillary regions. Thyroid:  There is normal attenuation.   Bones: There is no acute osseous abnormality.  Visualized upper abdomen: Unremarkable  Soft tissues: Unremarkable.    Impression:  1. Stable cavitary nodule within the right middle lobe. Given stability and reported nonavidity on prior PET/CT, follow-up in 6-12 months is recommended.  2. No main or lobar pulmonary embolism.    Approved by: Luiz Ochoa, MD on 11/06/2018 10:19 PM    Assessment:   Betty Ray is a 58 year old female non smoker with Crohn's disease, likely metabolic syndrome, obesity and a stable RML lung nodule of unclear etiology, obstructive sleep apnea who is currently undergoing work-up for POTS.  She also has a history of a PE discovered about one year ago.  It sounds like her exertional dyspnea coincides with her tachycardia.    Plan:  I did review COVID-19 precautions including minimizing being outside around others, masking, social distancing when out, good handwashing and avoiding touching face.     Since her not using the CPAP was due to difficulty getting a full facemask and unfortunately she was not using it regularly because of this difficulty for the first month insurance would not cover the CPAP and it had to be returned.  Now that she has a mask that she knows she can use she would like to go back on it.  I told her I would redo the prescription and will send it to [facility] and see if we can get her approved.  She will have a repeat chest CT in 12 months from the last one which would be July 2021.  She has not yet received the flu shot for this season and was advised to do so.  No medication changes were made.  She will follow-up in 7 months after her chest CT with Dr. Lilia Pro sooner if needed.    She is aware to contact the office if there are any questions or concerns before the next visit.  Thank you for the opportunity  of working with your patient.    Patient Instructions   Get flu shot for the season.  Will repeat chest CT in 12 months from the last one (July 2021).   Will order CPAP for you.  Follow up in 7 months after the chest CT with Dr. Lilia Pro.        The plan was discussed with the patient and the patient/patient rep demonstrated understanding to the provider's satisfaction.    Consent was previously obtained from the patient to complete this telephone consult; including the potential for financial liability.    32 minutes was spent on the phone not counting reviewing the EMR and management of this patient.     Electronically signed by Burgess Amor, NP 04/04/2019 2:14 PM

## 2019-05-07 ENCOUNTER — Telehealth: Payer: Self-pay

## 2019-05-07 DIAGNOSIS — G4733 Obstructive sleep apnea (adult) (pediatric): Secondary | ICD-10-CM

## 2019-05-07 NOTE — Telephone Encounter (Signed)
PT can not be set up with a new cpap until she has a new sleep study done per insurance guidelines. Need to have new sleep study ordered.

## 2019-05-08 NOTE — Telephone Encounter (Signed)
Referred to the sleep study for repeat testing and to assist patient with CPAP use since she failed it previously.

## 2019-05-18 LAB — UNMAPPED LAB RESULTS
Basophil # (HT): 0.1 10 3/uL — NL (ref 0.0–0.2)
Basophil % (HT): 1 % — NL (ref 0–2)
Eosinophil # (HT): 0.3 10 3/uL — NL (ref 0.0–0.5)
Eosinophil % (HT): 3 % — NL (ref 0–7)
Hematocrit (HT): 37 % — ABNORMAL LOW (ref 40–52)
Hemoglobin (HGB) (HT): 12.1 g/dL — NL (ref 11.5–16.0)
Lymphocyte # (HT): 2.6 10 3/uL — NL (ref 0.9–3.8)
Lymphocyte % (HT): 28 % — NL (ref 17–44)
MCHC (HT): 32.6 g/dL — NL (ref 32.0–36.0)
MCV (HT): 85 fL — NL (ref 81–99)
Mean Corpuscular Hemoglobin (MCH) (HT): 27.8 pg — NL (ref 26.0–34.0)
Monocyte # (HT): 0.7 10 3/uL — NL (ref 0.2–1.0)
Monocyte % (HT): 7 % — NL (ref 4–15)
Neutrophil # (HT): 5.8 10 3/uL — NL (ref 1.5–7.7)
Platelets (HT): 214 10 3/uL — NL (ref 140–400)
RBC (HT): 4.36 10 6/uL — NL (ref 3.80–5.20)
RDW (HT): 13 % — NL (ref 11.5–15.0)
Seg Neut % (HT): 61 % — NL (ref 43–75)
WBC (HT): 9.5 10 3/uL — NL (ref 4.0–10.0)

## 2019-05-19 LAB — UNMAPPED LAB RESULTS
Basophil # (HT): 0.1 10 3/uL — NL (ref 0.0–0.2)
Basophil % (HT): 1 % — NL (ref 0–2)
Eosinophil # (HT): 0.5 10 3/uL — NL (ref 0.0–0.5)
Eosinophil % (HT): 7 % — NL (ref 0–7)
Hematocrit (HT): 36 % — ABNORMAL LOW (ref 40–52)
Hemoglobin (HGB) (HT): 11.6 g/dL — NL (ref 11.5–16.0)
Lymphocyte # (HT): 2.8 10 3/uL — NL (ref 0.9–3.8)
Lymphocyte % (HT): 40 % — NL (ref 17–44)
MCHC (HT): 32.1 g/dL — NL (ref 32.0–36.0)
MCV (HT): 86 fL — NL (ref 81–99)
Mean Corpuscular Hemoglobin (MCH) (HT): 27.8 pg — NL (ref 26.0–34.0)
Monocyte # (HT): 0.6 10 3/uL — NL (ref 0.2–1.0)
Monocyte % (HT): 8 % — NL (ref 4–15)
Neutrophil # (HT): 3.1 10 3/uL — NL (ref 1.5–7.7)
Platelets (HT): 215 10 3/uL — NL (ref 140–400)
RBC (HT): 4.18 10 6/uL — NL (ref 3.80–5.20)
RDW (HT): 13.2 % — NL (ref 11.5–15.0)
Seg Neut % (HT): 44 % — NL (ref 43–75)
WBC (HT): 7.1 10 3/uL — NL (ref 4.0–10.0)

## 2019-05-29 LAB — UNMAPPED LAB RESULTS
Basophil # (HT): 0.1 10 3/uL — NL (ref 0.0–0.2)
Basophil % (HT): 1 % — NL (ref 0–2)
Eosinophil # (HT): 0.4 10 3/uL — NL (ref 0.0–5.0)
Eosinophil % (HT): 5 % — NL (ref 0–7)
Hematocrit (HT): 37 % — NL (ref 34–47)
Hemoglobin (HGB) (HT): 11.9 g/dL — NL (ref 11.5–16.0)
Lymphocyte # (HT): 2.2 10 3/uL — NL (ref 0.9–3.8)
Lymphocyte % (HT): 29 % — NL (ref 17–44)
MCHC (HT): 32.2 g/dL — NL (ref 32.0–36.0)
MCV (HT): 85.4 fL — NL (ref 81.0–99.0)
Mean Corpuscular Hemoglobin (MCH) (HT): 27.5 pg — NL (ref 26.0–34.0)
Monocyte # (HT): 0.6 10 3/uL — NL (ref 0.2–1.0)
Monocyte % (HT): 7 % — NL (ref 4–12)
Neutrophil # (HT): 4.4 10 3/uL — NL (ref 1.8–8.0)
Platelets (HT): 238 10 3/uL — NL (ref 140–400)
RBC (HT): 4.32 10 6/uL — NL (ref 3.80–5.20)
RDW (HT): 12.9 % — NL (ref 11.5–15.0)
Seg Neut % (HT): 57 % — NL (ref 40–75)
WBC (HT): 7.7 10 3/uL — NL (ref 4.0–10.8)

## 2019-05-30 ENCOUNTER — Encounter: Payer: Self-pay | Admitting: Pulmonology

## 2019-05-31 ENCOUNTER — Telehealth: Payer: Self-pay

## 2019-05-31 NOTE — Telephone Encounter (Signed)
Copied from CRM [id number]. Topic: Appointments - Schedule Appointment  >> May 31, 2019  2:28 PM Benay Spice wrote:  Angelique Blonder with [facility] called stating Betty Fortes NP, referred patient to our office for OSA. She stated patient has not received a call from our office to schedule. She is requesting we call patient to schedule at [phone].

## 2019-06-03 NOTE — Telephone Encounter (Signed)
Left voice mail for the patient to schedule a new patient visit per referral from Durward Fortes NP- no prior sleep studies done at our office- patient was scheduled three times before in 2011- patient never arrived for the studies or cancelled- checked the archives - no study recorded   Referral in the wq

## 2019-06-17 ENCOUNTER — Telehealth: Payer: Self-pay | Admitting: Sleep Medicine

## 2019-06-17 NOTE — Telephone Encounter (Signed)
Copied from CRM [id number]. Topic: Appointments - Schedule Appointment  >> Jun 17, 2019  1:09 PM Diana Eves wrote:  Patient, Betty Ray telephoned to schedule NPV p/Referral from Yves Dill    Appointment scheduled for March 23rd at 8:00 am w/[place]    If need be, Patient can be reached at Phone #[phone].

## 2019-06-26 ENCOUNTER — Telehealth: Payer: Self-pay

## 2019-06-26 NOTE — Telephone Encounter (Signed)
LM for pt to call and reschedule NPV on 3/25 with Dr. Sela Hua to 3/11.

## 2019-07-02 NOTE — Telephone Encounter (Signed)
LM for pt to reschedule 3/25 NPV.

## 2019-07-18 ENCOUNTER — Ambulatory Visit: Payer: Medicare (Managed Care) | Admitting: Sleep Medicine

## 2019-08-13 ENCOUNTER — Telehealth: Payer: Self-pay

## 2019-08-13 NOTE — Telephone Encounter (Addendum)
Dr. Berna Spare- FYI- This patient has an appointment with you tomorrow, 08/14/19. She did have CPAP through [facility], but returned in in November 2020 d/t non-compliance. I put a copy of her last report in your mailbox in case you want to review it. Thanks

## 2019-08-14 ENCOUNTER — Telehealth: Payer: Medicare (Managed Care) | Admitting: Sleep Medicine

## 2019-08-14 ENCOUNTER — Ambulatory Visit: Payer: Medicare (Managed Care) | Admitting: Sleep Medicine

## 2019-08-27 ENCOUNTER — Telehealth: Payer: Self-pay

## 2019-08-27 ENCOUNTER — Encounter: Payer: Self-pay | Admitting: Sleep Medicine

## 2019-08-27 ENCOUNTER — Ambulatory Visit: Payer: Medicare (Managed Care) | Attending: Sleep Medicine | Admitting: Sleep Medicine

## 2019-08-27 VITALS — BP 107/60 | HR 78 | Temp 97.9°F | Ht 67.5 in | Wt 226.9 lb

## 2019-08-27 DIAGNOSIS — G4733 Obstructive sleep apnea (adult) (pediatric): Secondary | ICD-10-CM | POA: Insufficient documentation

## 2019-08-27 NOTE — Telephone Encounter (Signed)
LM to call back to office
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
## 2019-09-08 ENCOUNTER — Telehealth: Payer: Self-pay

## 2019-09-08 NOTE — Telephone Encounter (Signed)
Tech called to confirm Home Study pickup for 09/10/2019. No answer and tech left a message.

## 2019-09-09 NOTE — Progress Notes (Deleted)
[facility]       HOME SLEEP STUDY POST TEST QUESTIONNAIRE      Patient Name:        Betty Ray    MRN #  [id number]      1. What was your approximate bedtime? _________________________      2.  How long did it take you to fall asleep? _________________________      3. How many times did you wake up? _________________________      a. Approximately what time? / How long were you awake?    _____________/_______________    _____________/_______________    _____________/_______________    _____________/_______________    4. At what time did you get up for the day? __________________________    5. Any problems or comments?  __________________________               Patient signature:______________________   Date:_______________            Please Complete this page  &  Return with Sleep Study Case.Marland KitchenMarland KitchenThank You!                                                                                                      [facility]    HOME SLEEP TESTING EQUIPMENT AGREEMENT FORM    PATIENT NAME:  Betty Ray     MRN #:    [id number]           PHONE NUMBER:   [phone]     ADDRESS:   [address]  [place] [address]    I have received a Home Sleep Monitor (Alice NightOne) from the [facility] and have been instructed how to use it.    Serial Number:  [id number]    I have agreed to return this monitor to [address] on  ______________ between 6:00 AM and 1:00 PM.     Anyone can drop-off HSAT device for a patient   To coordinate a later drop-off time please ask your sleep tech Or call the Sleep Tech Team directly @ [phone]   To ensure patient health, please maintain social distancing rules during drop off.     Sleep Lab open until 4:30 pm Monday thru Friday    If you have any questions after initial equipment trial, please call our office at [phone].    This monitor is the property of [facility].  The value of this equipment is $2,495.  If you fail to return it  by the date stated above, I understand that the Hospitals legal authorities will be notified.  Failure to return this monitor is considered theft of hospital property,                                      READ THIS FIRST    Home Sleep Study Guide     For Home Sleep Study set-up instructions watch the following video: [email or web address], you can  click the link sent via a MyChart message or search the Internet for [employer].  Refer to the provided instructions as needed.     To ensure proper setup we suggest attempting a practice session and assemble the unit on yourself. Call the lab at [phone] with any questions or issues.       Once the practice session is over, simply unplug one side of the belt by squeezing the sides of the buckle and the study will stop automatically.  Leave everything else connected.       Repeat the set-up process at bedtime, making sure you adequately tape the cannula and oximeter.  Sleep in whatever position you prefer.  If you get up during the night you can leave the equipment on.  However, if you do need to remove some equipment, or if it comes off during the night, simply put it back on.         In the morning, unplug one side of the belt to stop the study and discard nasal cannula before placing equipment back in the box.  All other equipment can remain attached to the unit.       Return the unit, with patient questionnaire in the box, to the lab the next business day between 6:00 am and 1:00 pm  Or Please call [phone].     To report a drop-off call [phone], the device can be left on the table near our front entrance.       If another patient is at the door, wait until they have returned to their car and the table has been cleared before you drop off the device.     Results will be communicated via telemedicine or My Chart.      If you or someone in your home develop COVID-19 symptoms within 24 hours of the study please  call the lab at [phone].                                                       [facility] SLEEP TESTING EQUIPMENT AGREEMENT FORM    PATIENT NAME:  Betty Ray     MRN #:    [id number]           PHONE NUMBER:   [phone]     ADDRESS:   [address] [address]    I have received a Home Sleep Monitor (Alice NightOne) from the [facility] and have been instructed how to use it.    Serial Number:  [id number]    I have agreed to return this monitor to [address] on  ______________ between 6:00 AM and 1:00 PM.     Anyone can drop-off HSAT device for a patient   To coordinate a later drop-off time please ask your sleep tech Or call the Sleep Tech Team directly @ [phone]   To ensure patient health, please maintain social distancing rules during drop off.     Sleep Lab open until 4:30 pm Monday thru Friday    If you have any questions after initial equipment trial, please call our office at [phone].    This monitor is the property of [facility].  The value of this equipment is $2,495.  If you fail to return it by the date stated above, I understand that the Hospitals legal authorities will be notified.  Failure to return this monitor is considered theft of hospital property.      Signed:  ________________________   Date: _________      Witness:  _______________________   Date: _________

## 2019-09-13 NOTE — Progress Notes (Signed)
[place] STUDY POST TEST QUESTIONNAIRE      Patient Name:        Betty Ray    MRN #  [id number]      1. What was your approximate bedtime? _________________________      2.  How long did it take you to fall asleep? _________________________      3. How many times did you wake up? _________________________      a. Approximately what time? / How long were you awake?    _____________/_______________    _____________/_______________    _____________/_______________    _____________/_______________    4. At what time did you get up for the day? __________________________    5. Any problems or comments?  __________________________               Patient signature:______________________   Date:_______________            Please Complete this page  &  Return with Sleep Study Case.Marland KitchenMarland KitchenThank You!                                                                                                      [place] SLEEP TESTING EQUIPMENT AGREEMENT FORM    PATIENT NAME:  Betty Ray     MRN #:    [id number]           PHONE NUMBER:   [phone]     ADDRESS:   [address] [address]    I have received a Home Sleep Monitor (Alice NightOne) from the [facility] and have been instructed how to use it.    Serial Number:  [id number]    I have agreed to return this monitor to [address] on  ______________ between 6:00 AM and 1:00 PM.     Anyone can drop-off HSAT device for a patient   To coordinate a later drop-off time please ask your sleep tech Or call the Sleep Tech Team directly @ [phone]   To ensure patient health, please maintain social distancing rules during drop off.     Sleep Lab open until 4:30 pm Monday thru Friday    If you have any questions after initial equipment trial, please call our office at [phone].    This monitor is the property of [facility].  The value of this equipment is $2,495.  If you fail to return it  by the date stated above, I understand that the Hospitals legal authorities will be notified.  Failure to return this monitor is considered theft of hospital property,                                      READ THIS FIRST    Home Sleep Study Guide     For Home Sleep Study set-up instructions watch the following video: [email or web address], you can  click the link sent via a MyChart message or search the Internet for [employer].  Refer to the provided instructions as needed.     To ensure proper setup we suggest attempting a practice session and assemble the unit on yourself. Call the lab at [phone] with any questions or issues.       Once the practice session is over, simply unplug one side of the belt by squeezing the sides of the buckle and the study will stop automatically.  Leave everything else connected.       Repeat the set-up process at bedtime, making sure you adequately tape the cannula and oximeter.  Sleep in whatever position you prefer.  If you get up during the night you can leave the equipment on.  However, if you do need to remove some equipment, or if it comes off during the night, simply put it back on.         In the morning, unplug one side of the belt to stop the study and discard nasal cannula before placing equipment back in the box.  All other equipment can remain attached to the unit.       Return the unit, with patient questionnaire in the box, to the lab the next business day between 6:00 am and 1:00 pm  Or Please call [phone].     To report a drop-off call [phone], the device can be left on the table near our front entrance.       If another patient is at the door, wait until they have returned to their car and the table has been cleared before you drop off the device.     Results will be communicated via telemedicine or My Chart.      If you or someone in your home develop COVID-19 symptoms within 24 hours of the study please  call the lab at [phone].                                                       [facility]    HOME SLEEP TESTING EQUIPMENT AGREEMENT FORM    PATIENT NAME:  Betty Ray     MRN #:    [id number]           PHONE NUMBER:   [phone]     ADDRESS:   [address]  [place] [address]    I have received a Home Sleep Monitor (Alice NightOne) from the [facility] and have been instructed how to use it.    Serial Number:  [id number]    I have agreed to return this monitor to [address] on  ______________ between 6:00 AM and 1:00 PM.     Anyone can drop-off HSAT device for a patient   To coordinate a later drop-off time please ask your sleep tech Or call the Sleep Tech Team directly @ [phone]   To ensure patient health, please maintain social distancing rules during drop off.     Sleep Lab open until 4:30 pm Monday thru Friday    If you have any questions after initial equipment trial, please call our office at [phone].    This monitor is the property of [facility].  The value of this equipment is $2,495.  If you fail to return it by the date stated above, I understand that the Hospitals legal authorities will be notified.  Failure to return this monitor is considered theft of hospital property.      Signed:  ________________________   Date: _________      Witness:  _______________________   Date: _________

## 2019-09-16 ENCOUNTER — Ambulatory Visit: Payer: Medicare (Managed Care) | Attending: Sleep Medicine | Admitting: Sleep Medicine

## 2019-09-16 DIAGNOSIS — G4733 Obstructive sleep apnea (adult) (pediatric): Secondary | ICD-10-CM | POA: Insufficient documentation

## 2019-09-18 NOTE — Procedures (Addendum)
[facility] - Home Sleep Apnea Testing Report  ---------------------------------------------------------------------------------------    This is a report for a nocturnal, unattended home sleep apnea test (HSAT) completed by the [facility] on Sep 16, 2019.    This study utilized a 6-channel Type III home sleep respiratory recorder.  The following parameters were recorded for 362 minutes: snoring (high frequency vibrations in airflow), oronasal pressure airflow, RIP chest effort, SpO2, pulse rate, and body position.  There was no EEG monitoring during this study, as such we were not able to discern between periods of sleep and wake. As such, HSATs tend to underestimate the actual severity of sleep disordered breathing. The quality of this recording was good.    This study is suggestive of a diagnosis of mild obstructive sleep apnea. Her unattended apnea hypopnea index (uAHI) was 10.5 respiratory events per hour of recording time. Her oxygen saturation nadir was 85%.     This study was electronically signed by Donne Hazel, MD on 09/19/2019 at 7:09 AM.

## 2019-10-08 ENCOUNTER — Ambulatory Visit: Payer: Medicare (Managed Care) | Attending: Sleep Medicine | Admitting: Sleep Medicine

## 2019-10-08 DIAGNOSIS — G4733 Obstructive sleep apnea (adult) (pediatric): Secondary | ICD-10-CM | POA: Insufficient documentation

## 2019-10-08 MED ORDER — CPAP MACHINE *A*
0 refills | Status: DC
Start: 2019-10-08 — End: 2019-10-14

## 2019-10-08 NOTE — Patient Instructions (Signed)
[facility]:   Obstructive Sleep Apnea Treatment    Your sleep test demonstrated mild obstructive sleep apnea (OSA).  There were an average of 11 times per hour that your breathing stopped or became shallow (if more than 5x per hour, it is considered positive for sleep apnea).  Your oxygen level dropped to as low as 85% (Below 90% is considered abnormal).    The main reason to treat this degree of sleep apnea is to improve the quality of your sleep.    There are a number of potential treatment options for obstructive sleep apnea (OSA).  The choice of options appropriate for you depends on a combination of the severity of the OSA, your weight, and the anatomy (structure) of your nose, throat, and jaw.    As excessive weight often may be contributing to OSA, weight loss may be very helpful and potentially curative.  Position may also impact OSA with sleep apnea often being worse when sleeping on one's back.  If your OSA is positional, then measures to stay off your back can be quite effective.  Nasal congestion can sometimes play a role so if you have a lot of congestion then treating it may help.  Other factors that can impact OSA include cigarettes and alcohol, so we recommend cigarette cessation (if you are a smoker) and alcohol consumption to be in moderation.    The most effective treatment option which works for almost everyone with OSA is CPAP (continuous positive airway pressure).  CPAP involves a mask which may simply sit under the nose, similar to an oxygen canula, go over the nose, or cover the nose and mouth (choice of mask is based on patient comfort).  This mask is then attached to a device that blows air and creates a pressure in the throat which keeps the throat open. CPAP is very quiet and it will eliminate snoring.  It is different and takes a little getting used to, but once adjusted to the CPAP, most people find that their sleep is much improved.  Provent external airway pressure  devices are small Band-Aid like devices that go over each nostril and act to create positive pressure on exhalation (similar to a CPAP effect).  They are disposable, single use devices which are probably best suited for mild OSA or snoring.  Sometimes CPAP users may use these when traveling and unable to access CPAP.    A second option for treatment is a dental appliance which is a mouth guard like device that attaches to the upper and lower teeth and holds the jaw forward.  It is most effective for mild to moderate OSA and it would be obtained from a dentist.    Lastly, there are several types of surgery that can be performed.  These procedures range from clearing out obstructions in the nose, to removing tonsils and excessive tissue in the throat, to breaking the jaw and moving it forward.  These surgeries may be helpful for select patients but unfortunately are often not that effective in curing sleep apnea.    Some web sites that may add additional information include:    American Sleep Apnea Association    www.sleepapnea.org  National Sleep [place].org  American Academy of Sleep Medicine www.sleepeducation.com      Call [phone] or send a message through MyChart if you have any questions.    [facility]:   Obstructive Sleep Apnea    Obstructive sleep  apnea (OSA) is a very common condition affecting 4-8% of American adults.  It is caused by the back of the throat blocking off during sleep leading either to a complete stoppage of breathing or a reduction in airflow.  These breathing disturbances then lead to a poor quality sleep although the person with OSA may have no idea that these events are occuring.  Almost everyone with OSA snores, although just because a person snores does not mean that they have OSA.        Since OSA can lead to a poor quality sleep, one of the main symptoms is excessive sleepiness.  It can also negatively impact memory, concentration, and  mood just like a lack of sleep can.  In addition to these effects on how a person feels, it may also affect your health.  It can contribute to high blood pressure, Diabetes, and high cholesterol. It  also increases risk for heart disease, irregular heart beat, and stroke.    One of the biggest dangers of OSA, or any condition that makes a person tired is drowsiness with driving.  A drowsy driver is impaired to the same degree as a person that is legally drunk.  People with sleep apnea may have a 2-4 fold increased risk for a car accident.  If you are driving and start feeling sleepy, you need to pull over and take a break (perhaps even drink a cafeinated beverage if necessary).  Measures like rolling down the window or turning up the radio are not effective and will not help you to arrive at your destination safely.    There are a number of factors that can increase the risk for sleep apnea.  Obesity is a common factor, therefore if a person is overweight and has OSA then weight loss can sometimes be curative.  Other factors include family history, enlarged tonsils, nose and jaw structure, as well as alcohol and cigarette use.    Some web sites that may add additional information include:  American Sleep Apnea Association                                   www.sleepapnea.org  National Sleep [employer]	[place].org  American Academy of Sleep Medicine                    www.sleepeducation.com      12 Practice Tips for Getting the Most out of Your  CPAP/BiPAP Treatment    #1: Adjust your humidifier levels seasonally    Youll need more moisture during the winter months, so move your humidity levels up (1/2 to 1 level at a time until youre satisfied). During the summer months, when air is warmer and more humid, you can turn down your humidifier levels.    #2: Keep your CPAP at or slightly beneath height of your head and make sure it is placed  on a hard surface.    Condensation may run down into your hose if its above your head and you risk having your machine fall on you.    #3: Always  use distilled water in your machine.    Tap water generally contains fluoride and other substances that may be harmful to your lungs. Further, the use of non-distilled water can lead to white mineral deposits  accumulating in your water chamber. Imagine the inside of a shower with hard water stains.    #4: If you feel claustrophobic or are having trouble getting used to your mask practice by holding the mask up to your face without any of the other parts.     Once you're comfortable with that, try wearing the mask with the straps. Hold the mask and hose (without the straps) with the hose attached to the CPAP machine at a low pressure setting (turn the ramp feature on). Wear the mask with the straps air pressure machine turned on while awake and use your CPAP for 15 to 20-minute periods while relaxing, watching TV, etc. After you're comfortable with that, try sleeping with it on.    #5: Although it is normal and usually temporary, some of our patients have trouble falling asleep when they begin treatment. If this is your situation, consider      Using your machines ramp feature to gradually increase air pressure over time.   Avoiding caffeine and alcohol before bedtime.   Exercising regularly.   Taking a warm bath before bedtime.   Avoiding going to bed until youre tired.    #6: If you experience difficulty tolerating the air pressure try using your machines ramp feature.     This will allow you to gradually increase air pressure over time. You can also re-set the ramp feature if you are waking up to high pressures during the night.     #7: Once youve adjusted to your CPAP machine use it consistently - every time you sleep - including for naps.    #8: If you regularly suffer with a dry or stuffy nose you should consider increasing the humidifier  level on your CPAP device.     You may also need to increase the temperature on your tubing to prevent condensation from the increased humidity. Your doctor may also recommend an over-the-counter nasal steroid spray for you or you can swab your nasal passages with nasal saline gel. You may also use a nasal wash, such as a sinus rinse bottle or Neti-pot. Avoid the use of petroleum jelly- based products in your nose.    #9: Spend a few minutes each day cleaning your equipment    For cleaning we suggest using liquid dish detergent and warm water. Avoid using hot water. Always make sure you remove all of the soap. Most manufacturers of CPAP masks include cleaning instructions in the packaging as well. After cleaning your tubes and hoses in warm soapy water and rinsing them thoroughly, you can let them air dry by hanging them over a towel rack or shower rod.    #10: Change your filter once a month and any time you notice the filter is changing colors.      #11: Try using zinc oxide ointment for soothing minor skin irritations.     #12: If you are experiencing any difficulty adjusting to using CPAP at home, please dont hesitate to call us at [phone] or contact your sleep physician or PA via [place]. We have CPAP specialists who can help troubleshoot any issues that may come up.     Types of CPAP Masks    Adjusting to CPAP masks can take time. Often times, people have  to try more than one style of mask before findings the perfect fit. There are three main types of masks: nasal pillows, nasal masks, and full face masks.    Nasal pillows rest at the entrance of the nostrils below the nose.  Pros:   Provide a feeling of openness and freedom, which helps you sleep more comfortably with fewer interruptions.    Keeps your field of vision clear--beneficial for those who wear glasses, or like reading or watching television before going to bed.    Causes less air leakage due to direct airflow into the nasal  passages.    Great for patients with lots of facial hair who have trouble getting a seal with a standard CPAP mask that fits over the nose or nose and mouth.    Reduces the feeling of claustrophobia due to having minimal coverage over the face.  Cons:   Causes discomfort when used under higher-pressure due to direct airflow into nostrils.    May cause nosebleeds and dryness of the nose.    Patients with sensitive skin may develop an allergic reaction to the plastic.    Using ill-fitting nasal pillows could lead to pressure ulcers within the nostrils.   Nasal pillows are not the best option for mouth breathers    Nasal masks cover the nose and are lighter and smaller than full face masks, but provide better coverage than a nasal pillow.  Pros:   Works best if you mostly breathe through your nose only.    Nasal cushions create a good seal around your nose, while keeping a small profile. The masks also come with forehead support, which gives you stability and support for your mask.    Provide more natural airflow than nasal pillows, as the delivered pressure isn't as direct.    Better for higher-pressure settings than nasal pillows.    Offers flexible fit. There are many different styles cater to a wide range of facial structures and features.  Cons:   Nasal masks are not the best option for mouth breathers.    May cause red marks around the nose due to tightening the mask too much.    May cause extra discomfort if worn by those who have sinus issues that are allergy or cold-related.    Full face masks cover both the nose and mouth.   Pros:   Great for mouth breathers.    Great for those who suffer from frequent incidents of dry mouth from CPAP    Effectively prevents mask leakage from the mouth for those who sleep on their back.    Works best with patients who require higher CPAP pressure levels. Due to the larger surface area of the mask, higher pressures do not make you feel uncomfortable.     Ideal for some patients who find CPAP masks to be claustrophobic because the full-face mask touches the outside of the face instead of the upper lip and or the bridge of the nose.   Cons:   Due to the masks larger surface area, there is a higher chance of air leakage.    May cause dry, irritated eyes.    Difficult to watch TV, read or wear glasses while wearing the mask.    Not great for stomach sleepers due to the bulk of the mask.    * adapted from [place] [employer] - Website      Rules of Replacement    The life of CPAP supplies depends on  the brand and style you buy, how well you care for them, and how often you use them. Follow cleaning instructions carefully to keep them looking and working their best.    Even if your CPAP supplies sparkle, it doesnt mean theyll last forever. Even with the best cleaning, parts can eventually grow bacteria and make you sick if theyre not replaced regularly. Thats why most masks and accessories follow some general rules for replacement. You also should check with your supplier for specific details about your mask.     Here are some general rules for replacing your CPAP supplies:    What: Air Filters  Why: Filters can wear out or become clogged over time, potentially exposing you to airborne particles and mold.  When: As soon as it becomes discolored or at least every 60 days.     * Most filters are disposable and are not meant to be cleaned, but discarded when dirty. If you are using an older device, you may have a non-disposable filter which should be cleaned as soon as it becomes discolored and replaced every 6-12 months.      * Keep a closer eye on your filter in the fall and winter -- they tend to get dustier when the heat is on and the windows are closed. If you live in a house with pets or smoke, you may need to replace the filters more often.    What: Mask cushion/nasal pillow  Why: The material will soften with wear and  eventually becomes too soft to hold a seal. This can lead to increased mask leaks which may disrupt your sleep and reduce the effectiveness of CPAP therapy.   When: Every 6-8 weeks. Consider replacing your cushion/pillow sooner if your straps need to be tightened to get the same mask seal.     What: Headgear  Why: The headgear straps lose elasticity over time and must be tightened more and more to achieve the same quality seal. Overtightening can cause facial sores and make treatment uncomfortable.   When: Every 6-12 months.    What: Hose/Tubing   Why: The hose may develop small holes or tears, which can cause air leaks. If your hose is leaking, you may not be receiving your prescribed therapy setting from your CPAP. This can cause you to feel like youre not sleeping as well.  When: About every 12 months.    * Signs of wear to your hose include dry, cracked places on the inside lining or on the rubber ends; "stretch marks" near the rubber ends; mineral deposits or mold from water left inside the hose; or a visible puncture or tear in the material.     What: Water Chamber  Why: Water chambers may become discolored, cracked, cloudy, or even pitted due to the mineral levels found in most tap and drinking water. As the material deteriorates, cracks may trap bacteria from moisture.  When: About every 12 months.    Whos got you covered?    Medicare and most private insurance plans cover the supplies recommended above. Ask your durable medical equipment supplier about your specific coverage. Some equipment suppliers use automatic systems for replacement that can save you extra work and worry. Theyll check the type of supplies your plan replaces, how often theyll replace them, and then theyll ship them to you automatically. If your company doesnt have a plan like this, see if they can email or call you with a reminder to replace your supplies. If you  have higher co-pays or pay out of pocket, ask your supplier about  getting a mask with removable parts. It can be cheaper to replace single parts than having to replace the whole mask. By using clean, fresh, up-to-date CPAP supplies, youll stay healthier, sleep deeper, and breathe easier.      myAir    For patients using a ResMed AirSense 10 or AirCurve 10 unit, you can register for an Surveyor, quantity. This program syncs with your CPAP unit wirelessly to provide you with data on how you slept the night before and helps you stay on track with your sleep apnea treatment.     You can register for myAir if you copy and paste or type the website address below:    [email or web address] (physical sciences) (DME) Vendors By Region    [place]   [place]    Phone: [phone]   Fax: [phone] [facility]   [address]  Phone: [phone]  Fax: [phone]   [facility]   [address]   Phone: [phone]   Fax: [phone]   [place]   [place]   Phone: [phone]   Fax: [phone]  [place] [facility] on select Fridays  Phone: [phone]   [place]   [address]   Phone: [phone]   Fax: [phone] [facility]  [address]  Phone: [phone] or [phone] ext. [phone]  Fax: [phone]   [place]   [facility]  [place]  Phone: [phone]  Fax: [phone] [facility]   [address] [facility])  Phone: [phone]   Fax: [phone]   [facility]  [phone]   Phone: [phone]   Fax: [phone] [facility]   [address]  Phone: [phone]  Fax: [phone]  *Respiratory therapist available Wed, Fri, &  Sat   [place]   Phone: [phone]   Fax [phone] [place]   Phone: [phone]   Fax: [phone]   [place]  [address]  Phone: [phone]  Fax: [phone] [facility]  [address]  Phone: [phone]  Fax: [phone]     [facility]: Accepts Medicare, Medicare Advantage (MVP, Aetna, Excellus), Excellus, Cigna (limited), [place], [place], [place], MVP, Fidelis, BC/BS [place], BCO, [place] [facility]/[place] [facility]: International Paper, [employer] Company, Arvin[employer]itor, Medicaid, General Electric, [place], SLM Corporation, Fluor Corporation (2nd only), [place], [place], [place], IAC/InterActiveCorp, [place], BC/BS [place], [place], Marriott, Hilton, BCO, [employer]: International Paper, Medicare, Medicare Advantage Kellogg, Kohl's, General Electric, [place], LBS/[place], Tricare (some), [place], [place], [place], MVP, Fidelis, [place], UHC, [place], BC/BS [place], [place], [place], [place], Independent Health, [place], [place] [facility]: Fluent in [place]. Accepts Medicare, Medicare Advantage, Medicaid, Excellus, LBS/Rem[place]o, Univera, Champus/VA, Aetna, Anthem, [place], [place], BCO  [facility]: Accepts all insurances except Wellcare  [facility]: Accepts Medicare Excellus, Excellus, Anthem, BC/BC [place], [place], BCO  [facility]: Accepts UMR, Medicare, Medicare Advantage, Medicaid (2nd  only), Excellus, Cigna, Tricare, [place], Champus/VA, Aetna, Anthem, [place], [place], UHC, [place], BC/BS W[place], [place], Enbridge Energy, Marriott, [place], BCO, Motorola: Accepts all insurances except [employer]ger

## 2019-10-08 NOTE — Progress Notes (Signed)
[place] Visit Sleep Study Follow Up Visit    Patient's Location:  home  Other Attendees:  none  Provider:  Mackynzie Woolford Marilynne Halsted, PA  Location of Telemedicine Provider:  Administrative office, [facility], [place], [place]    Betty Ray is a 59 y.o. woman with a significant past medical history of sinus tachycardia, diabetes, depression, anxiety, pulmonary embolism, Crohn's disease and mild OSA. I spoke with her today regarding the results of recent sleep testing.     Presenting Symptoms:    She originally presented to our clinic 08/27/19 for evaluation of known, untreated mild obstructive sleep apnea.  She had used CPAP in the past with noticeable improvement of symptoms but was unable to meet compliance standards due to difficulty with mask fit.  Towards the end of her CPAP trial she found a full face mask that she was able to use without difficulty and wants to try CPAP again.    Sleep Study Findings:    Betty Ray completed a home sleep test on 09/16/19. The study was consistent with a diagnosis of  mild obstructive sleep apnea. Her unattended apnea hypopnea index (uAHI) was 10.5 respiratory events per hour of recording time. Her oxygen saturation nadir was 85%.     These findings may under estimate the true severity of sleep disordered breathing due to the limitations of home sleep testing.  The study quality was good.    QUESTIONNAIRE:     Recent Review Flowsheet Data     Epworth Sleep Scale 10/07/2019    Sitting and reading 0    Watching TV 1    Sitting inactive in a public place (e.g a theater or a meeting) 0    As a passenger in a car for an hour without a break 1    Lying down to rest in the afternoon when circumstances permit 3    Sitting and talking to someone 0    Sitting quietly after a lunch without alcohol 0    In a car, while stopped for a few minutes in traffic 0    EPWORTH TOTAL  5        MEDICATIONS:     Current Outpatient Medications   Medication Sig    insulin regular (HUMULIN  R,NOVOLIN R) 100 unit/mL injection vial Inject into the skin 3 times daily (before meals)    insulin NPH (HUMULIN N,NOVOLIN N) 100 UNIT/ML injection vial Inject into the skin nightly    FLUoxetine (PROZAC) 40 MG capsule Take 40 mg by mouth daily    torsemide (DEMADEX) 10 MG tablet Take 10 mg by mouth every morning    losartan (COZAAR) 25 MG tablet Take 25 mg by mouth daily    nadolol (CORGARD) 20 MG tablet Take 20 mg by mouth daily    ALPRAZolam (XANAX) 0.5 MG tablet Take 0.5 mg by mouth every 8 hours as needed    warfarin (COUMADIN) 4 MG tablet Take 4 mg by mouth daily    atorvastatin (LIPITOR) 20 MG tablet Take 80 mg by mouth daily     ACCU-CHEK AVIVA test strip     NOVOFINE 32G X 6 MM MISC     Accu-Chek Multiclix Lancets lancets     CPAP machine (HCPCS [id number]) Auto CPAP 5-18 cmH2O with pressure relief & all needed supplies. Duration: Lifetime    tiZANidine (ZANAFLEX) 4 MG tablet TAKE 1 TABLET BY MOUTH EVERY 8 HOURS AS NEEDED    pantoprazole (PROTONIX) 40 MG EC tablet Take  40 mg by mouth 2 times daily Swallow whole. Do not crush, break, or chew.    diclofenac (VOLTAREN) 1 % gel Apply 2 g to affected area 3 times daily.    dicyclomine (BENTYL) 20 MG tablet TAKE 1 TABLET EVERY 8 HOURS PRN abdominal pain     No current facility-administered medications for this visit.     PAST MEDICAL HISTORY:     Patient Active Problem List   Diagnosis Code    Mild obstructive sleep apnea G47.33    Diastolic Dysfunction I51.9    Crohn's Disease K50.90    Exertional dyspnea R06.00     Patient's problem list, allergies, and medications were reviewed and updated as appropriate. Please see the EHR for full details.    IMPRESSION & PLAN:       ICD-10-CM ICD-9-CM    1. Mild obstructive sleep apnea  G47.33 327.23 CPAP machine (HCPCS [phone])    HST 09/16/19- uAHI 10.5, oxygen saturation nadir 85%      I reviewed the study findings and pathophysiology of obstructive sleep apnea and its association with cardiovascular disease  with Betty Ray. Treatment options including weight loss, CPAP, a dental appliance, and nasal/palatal surgery were discussed in detail. I feel that treatment is indicated to improve the quality of her sleep, but is not necessitated from a cardiovascular health standpoint.    CPAP is the first line treatment of choice in combination with weight loss and she is agreeable to this. I am writing a prescription for an auto titration CPAP device at 5-18 cmH2O for home usage to be sent to her chosen DME supplier.      I encouraged Betty Ray to use her device anytime she is sleeping for the most clinical benefit. I urged her to contact us or her DME should she experience any barriers acclimating to CPAP therapy.    Ms. Proctor will return in 3 months to verify efficacy of and compliance with CPAP. I reviewed insurance compliance requirements for coverage. She was encouraged to contact our office should she have any questions or concerns.     Betty Ray Chauncy Passy, PA  [facility]    An in-person visit with the patient was not advisable at this time due to COVID-19 concerns. In my professional opinion, my resulting orders would not have differed if an in-person consultation had been performed.    Consent was previously obtained from the patient to complete this telehealth consult; including the potential for financial liability. The plan was discussed with the patient and the patient/patient rep demonstrated understanding to the provider's satisfaction.    I personally spent 20 minutes on the calendar day of the encounter, including pre and post visit work.

## 2019-10-09 ENCOUNTER — Ambulatory Visit: Payer: Medicare (Managed Care) | Admitting: Radiology

## 2019-10-10 ENCOUNTER — Telehealth: Payer: Self-pay

## 2019-10-10 DIAGNOSIS — G4733 Obstructive sleep apnea (adult) (pediatric): Secondary | ICD-10-CM

## 2019-10-10 NOTE — Telephone Encounter (Signed)
Amy- patient needs to choose a new DME that accepts [employer]. We will need a script please.    Tim- Can you help patient select a DME since Maryelizabeth Kaufmann is not back until Tuesday?   Thanks

## 2019-10-14 ENCOUNTER — Telehealth: Payer: Self-pay

## 2019-10-14 MED ORDER — CPAP MACHINE *A*
0 refills | Status: AC
Start: 2019-10-14 — End: ?

## 2019-10-14 NOTE — Telephone Encounter (Signed)
Script for an autoCPAP @ 5-18cm and MRN have been faxed to [facility]    Device can be ResMed or Respironics

## 2019-10-14 NOTE — Telephone Encounter (Signed)
CPAP prescription regenerated per request.

## 2019-10-14 NOTE — Addendum Note (Signed)
Addended byDonivan Scull on: 10/14/2019 07:41 AM     Modules accepted: Orders

## 2019-10-22 ENCOUNTER — Ambulatory Visit: Payer: Medicare (Managed Care) | Admitting: Pulmonology

## 2019-10-22 ENCOUNTER — Other Ambulatory Visit: Payer: Medicare (Managed Care)

## 2019-10-23 ENCOUNTER — Telehealth: Payer: Self-pay

## 2019-10-23 NOTE — Telephone Encounter (Signed)
Pt seen this morning and set up on an Airsense 10 CPAP machine with settings:  Auto-titrate 5-18 cmH2O as per prescription.  She was fit for an Airtouch F20 ffm, size small at her request.  This is pt's second attempt at compliance with CPAP therapy.  She tried about a year ago and was not successful with the mask the vendor gave her.  She was able to get the AT F20 but then did not achieve compliance before her insurance auth expired.  Her information has been entered into [employer]view for remote compliance monitoring.  Will continue to follow to support compliance and provide supply replacement.  This pt will require a CPAP f/u appt.

## 2019-10-24 NOTE — Telephone Encounter (Signed)
Set up date 10/23/19  S10 Auto 5-18cm  Mask F20 ffm SM  CFU required, none sched    Can you please sched cfu 30-90 days, thanks

## 2019-11-06 ENCOUNTER — Telehealth: Payer: Self-pay | Admitting: Pulmonology

## 2019-11-06 NOTE — Telephone Encounter (Signed)
Copied from CRM [id number]. Topic: Appointments - Cancel Appointment  >> Nov 06, 2019  9:18 AM Andria Frames wrote:  Betty Ray is calling to cancel her appointment which is currently scheduled for tomorrow 11/07/2019.    Reason for the cancellation:Patient is having eye surgery    Has the appointment been cancelled? yes    Has the appointment been rescheduled? yes    Date of new appointment?02/06/2020     Does the patient need a call back to reschedule?no    Patient can be contacted back at [phone].    Marland Kitchen

## 2019-11-07 ENCOUNTER — Other Ambulatory Visit: Payer: Medicare (Managed Care)

## 2019-11-07 ENCOUNTER — Ambulatory Visit: Payer: Medicare (Managed Care) | Admitting: Pulmonology

## 2019-12-04 ENCOUNTER — Ambulatory Visit: Payer: Medicare (Managed Care) | Admitting: Sleep Medicine

## 2019-12-23 ENCOUNTER — Telehealth: Payer: Self-pay | Admitting: Sleep Medicine

## 2019-12-23 ENCOUNTER — Ambulatory Visit: Payer: Medicare (Managed Care) | Admitting: Sleep Medicine

## 2019-12-23 NOTE — Progress Notes (Deleted)
[facility] - {EAM phone/video visit type:29393::"Telemedicine Visit"} CPAP Follow Up Visit    Patient's Location:  home  Other Attendees:  {ATTENDEES (Optional):21014682::"none"}  Provider:  Lewie Deman Chauncy Passy, PA  Location of Telemedicine Provider:  {TELE LOCATION:21014635::"Home office"}      Betty Ray is a 59 y.o. {EAM PT Gender:22113::"lady"} with a significant past medical history of ***. I spoke with her today in follow up CPAP therapy for the treatment of obstructive sleep apnea (OSA).    Presenting Symptoms/Sleep Testing:    [name] was initially seen at our office *** for evaluation of {EAM OSA Symptoms:22033}. She completed a {EAM Study Type:22172::"home sleep test"} on *** which demonstrated {EAM lowercase Severity:23005::"mild"} OSA ({EAMAHI:26078::"uAHI"} ***, SpO2 nadir ***%, weight ***) and was prescribed auto CPAP.      CPAP Therapy/Interval Symptoms:        She denies {PAP Complaints:22036}.    PAP INTERROGATION:         QUESTIONNAIRE:     Recent Review Flowsheet Data     Epworth Sleep Scale 10/07/2019    Sitting and reading 0    Watching TV 1    Sitting inactive in a public place (e.g a theater or a meeting) 0    As a passenger in a car for an hour without a break 1    Lying down to rest in the afternoon when circumstances permit 3    Sitting and talking to someone 0    Sitting quietly after a lunch without alcohol 0    In a car, while stopped for a few minutes in traffic 0    EPWORTH TOTAL  5        MEDICATIONS:     Current Outpatient Medications   Medication Sig    CPAP machine (HCPCS [phone]) Auto CPAP 5-18 cmH2O with pressure relief, heated humidifier, climate line tubing & all needed supplies. Duration: Lifetime    insulin regular (HUMULIN R,NOVOLIN R) 100 unit/mL injection vial Inject into the skin 3 times daily (before meals)    insulin NPH (HUMULIN N,NOVOLIN N) 100 UNIT/ML injection vial Inject into the skin nightly    FLUoxetine (PROZAC) 40 MG capsule Take 40 mg by  mouth daily    torsemide (DEMADEX) 10 MG tablet Take 10 mg by mouth every morning    losartan (COZAAR) 25 MG tablet Take 25 mg by mouth daily    nadolol (CORGARD) 20 MG tablet Take 20 mg by mouth daily    tiZANidine (ZANAFLEX) 4 MG tablet TAKE 1 TABLET BY MOUTH EVERY 8 HOURS AS NEEDED    ALPRAZolam (XANAX) 0.5 MG tablet Take 0.5 mg by mouth every 8 hours as needed    warfarin (COUMADIN) 4 MG tablet Take 4 mg by mouth daily    pantoprazole (PROTONIX) 40 MG EC tablet Take 40 mg by mouth 2 times daily Swallow whole. Do not crush, break, or chew.    atorvastatin (LIPITOR) 20 MG tablet Take 80 mg by mouth daily     diclofenac (VOLTAREN) 1 % gel Apply 2 g to affected area 3 times daily.    ACCU-CHEK AVIVA test strip     NOVOFINE 32G X 6 MM MISC     Accu-Chek Multiclix Lancets lancets     dicyclomine (BENTYL) 20 MG tablet TAKE 1 TABLET EVERY 8 HOURS PRN abdominal pain     No current facility-administered medications for this visit.     PAST MEDICAL HISTORY:     Patient Active Problem List  Diagnosis Code    Mild obstructive sleep apnea G47.33    Diastolic Dysfunction I51.9    Crohn's Disease K50.90    Exertional dyspnea R06.00     Patient's problem list, allergies, and medications {WERE / WERE NOT:19253::"were"} reviewed and updated as appropriate. Please see the EHR for full details.    IMPRESSION & PLAN:     No diagnosis found.   Interrogation of [name]'s CPAP unit reveals {GOOD/FAIR/POOR:24734::"good"} compliance with {NO/MINIMAL/SIGNIFICANT:2103609::"minimal"} air leaks and {effective/ineffective:21014924::"effective"} treatment of sleep disordered breathing. {EAM CPAP Response:23183}    I applauded her adherence to CPAP and encouraged continued compliance with therapy. We reviewed proper care and maintenance of her CPAP device and interface. I urged [name] to contact her DME supplier or our office if she experiences any barriers to continued CPAP use.     [name] will return {EAM F/U:23184::"in  one year for annual follow up visit, or sooner as needed"}. She was instructed to contact our office with any questions or concerns.     Alfonso Carden Chauncy Passy, PA  [facility]    An in-person visit with the patient was not advisable at this time due to COVID-19 concerns. In my professional opinion, my resulting orders would not have differed if an in-person consultation had been performed.    Consent was previously obtained from the patient to complete this telehealth consult; including the potential for financial liability. The plan was discussed with the patient and the patient/patient rep demonstrated understanding to the provider's satisfaction.    I personally spent *** minutes on the calendar day of the encounter, including pre and post visit work.

## 2019-12-23 NOTE — Telephone Encounter (Signed)
Patient no showed on Zoom.  Telephone call to patient, left message for her to call to reschedule her missed appointment. Await patient contact.

## 2020-01-16 ENCOUNTER — Telehealth: Payer: Self-pay

## 2020-01-16 NOTE — Telephone Encounter (Signed)
Copied from CRM [id number]. Topic: Appointments - Schedule Appointment  >> Jan 16, 2020  3:54 PM Lauree Chandler wrote:  Patient no showed her 8/30 appointment and would like to reschedule. Writer did not find any time before the 90 day cutoff time for the insurance. Patient got the cpap in late June early July    Patient can be reached at [phone].

## 2020-01-20 ENCOUNTER — Ambulatory Visit: Payer: Medicare (Managed Care) | Attending: Sleep Medicine | Admitting: Sleep Medicine

## 2020-01-20 DIAGNOSIS — G4733 Obstructive sleep apnea (adult) (pediatric): Secondary | ICD-10-CM | POA: Insufficient documentation

## 2020-01-20 NOTE — Progress Notes (Signed)
[place] Visit CPAP Follow Up Visit    Patient's Location:  home  Other Attendees:  none  Provider:  Asif Muchow Marilynne Halsted, PA  Location of Telemedicine Provider:  Home office      Betty Ray is a 59 y.o. woman with a significant past medical history of sinus tachycardia, diabetes, depression, anxiety, pulmonary embolism, Crohn's disease and mild OSA. I spoke with her today in follow up CPAP therapy for the treatment of obstructive sleep apnea (OSA).    Presenting Symptoms/Sleep Testing:    She originally presented to our clinic 08/27/19 for evaluation of known, untreated mild obstructive sleep apnea.  She had used CPAP in the past with noticeable improvement of symptoms but was unable to meet compliance standards due to difficulty with mask fit.  She recently reinitiated another trial of CPAP after a Home Sleep Test 09/16/19 was consistent with a diagnosis of mild obstructive sleep apnea (uAHI 10.5, oxygen saturation nadir 85%).    CPAP Therapy/Interval Symptoms:     She reports that she currently has a cold which has hindered her CPAP use even though she has a full face mask.  Early in the current trial of CPAP she was very compliant with using it and has met insurance compliance standards.   When she uses the CPAP she feels like her sleep continuity is better and she has less daytime sleepiness.    She denies air hunger, pressure discomfort, interface discomfort, xerostomia, nasal dryness and nasal congestion.    PAP INTERROGATION:         QUESTIONNAIRE:     Recent Review Flowsheet Data     Epworth Sleep Scale 01/19/2020    Sitting and reading 0    Watching TV 1    Sitting inactive in a public place (e.g a theater or a meeting) 0    As a passenger in a car for an hour without a break 1    Lying down to rest in the afternoon when circumstances permit 3    Sitting and talking to someone 0    Sitting quietly after a lunch without alcohol 0    In a car, while stopped for a few minutes in  traffic 0    EPWORTH TOTAL  5        MEDICATIONS:     Current Outpatient Medications   Medication Sig    CPAP machine (HCPCS [id number]) Auto CPAP 5-18 cmH2O with pressure relief, heated humidifier, climate line tubing & all needed supplies. Duration: Lifetime    insulin regular (HUMULIN R,NOVOLIN R) 100 unit/mL injection vial Inject into the skin 3 times daily (before meals)    insulin NPH (HUMULIN N,NOVOLIN N) 100 UNIT/ML injection vial Inject into the skin nightly    FLUoxetine (PROZAC) 40 MG capsule Take 40 mg by mouth daily    torsemide (DEMADEX) 10 MG tablet Take 10 mg by mouth every morning    losartan (COZAAR) 25 MG tablet Take 25 mg by mouth daily    nadolol (CORGARD) 20 MG tablet Take 20 mg by mouth daily    tiZANidine (ZANAFLEX) 4 MG tablet TAKE 1 TABLET BY MOUTH EVERY 8 HOURS AS NEEDED    ALPRAZolam (XANAX) 0.5 MG tablet Take 0.5 mg by mouth every 8 hours as needed    warfarin (COUMADIN) 4 MG tablet Take 4 mg by mouth daily    pantoprazole (PROTONIX) 40 MG EC tablet Take 40 mg by mouth 2 times daily Swallow whole. Do  not crush, break, or chew.    atorvastatin (LIPITOR) 20 MG tablet Take 80 mg by mouth daily     diclofenac (VOLTAREN) 1 % gel Apply 2 g to affected area 3 times daily.    ACCU-CHEK AVIVA test strip     NOVOFINE 32G X 6 MM MISC     Accu-Chek Multiclix Lancets lancets     dicyclomine (BENTYL) 20 MG tablet TAKE 1 TABLET EVERY 8 HOURS PRN abdominal pain     No current facility-administered medications for this visit.     PAST MEDICAL HISTORY:     Patient Active Problem List   Diagnosis Code    Mild obstructive sleep apnea C14.48    Diastolic Dysfunction J85.6    Crohn's Disease K50.90    Exertional dyspnea R06.00     Patient's problem list, allergies, and medications were reviewed and updated as appropriate. Please see the EHR for full details.    IMPRESSION & PLAN:       ICD-10-CM ICD-9-CM    1. Mild obstructive sleep apnea  G47.33 327.23       Interrogation of [name]'s CPAP unit  reveals fair compliance with minimal air leaks and effective treatment of sleep disordered breathing. She reports clinical benefit since initiating CPAP therapy.    I encouraged increasing the humidity setting to help with nasal congestion while she has a cold.    I encouraged her adherence to CPAP and discussed the importance of compliance with therapy. We reviewed proper care and maintenance of her CPAP device and interface. I urged [name] to contact her DME supplier or our office if she experiences any barriers to continued CPAP use.     [name] will return as needed. She was instructed to contact our office with any questions or concerns.     [place], [place]    An in-person visit with the patient was not advisable at this time due to COVID-19 concerns. In my professional opinion, my resulting orders would not have differed if an in-person consultation had been performed.    Consent was previously obtained from the patient to complete this telehealth consult; including the potential for financial liability. The plan was discussed with the patient and the patient/patient rep demonstrated understanding to the provider's satisfaction.    I personally spent 22 minutes on the calendar day of the encounter, including pre and post visit work.

## 2020-01-20 NOTE — Patient Instructions (Signed)
12 Practice Tips for Getting the Most out of Your  CPAP/BiPAP Treatment    #1: Adjust your humidifier levels seasonally    You’ll need more moisture during the winter months, so move your humidity levels up (1/2 to 1 level at a time until you’re satisfied). During the summer months, when air is warmer and more humid, you can turn down your humidifier levels.    #2: Keep your CPAP at or slightly beneath height of your head and make sure it is placed on a hard surface.    Condensation may run down into your hose if it’s above your head and you risk having your machine fall on you.    #3: Always use distilled water in your machine.    Tap water generally contains fluoride and other substances that may be harmful to your lungs. Further, the use of non-distilled water can lead to white mineral deposits  accumulating in your water chamber. Imagine the inside of a shower with hard water stains.    #4: If you feel claustrophobic or are having trouble getting used to your mask practice by holding the mask up to your face without any of the other parts.     Once you're comfortable with that, try wearing the mask with the straps. Hold the mask and hose (without the straps) with the hose attached to the CPAP machine at a low pressure setting (turn the ramp feature on). Wear the mask with the straps air pressure machine turned on while awake and use your CPAP for 15 to 20-minute periods while relaxing, watching TV, etc. After you're comfortable with that, try sleeping with it on.    #5: Although it is normal and usually temporary, some of our patients have trouble falling asleep when they begin treatment. If this is your situation, consider     • Using your machine’s “ramp” feature to gradually increase air pressure over time.  • Avoiding caffeine and alcohol before bedtime.  • Exercising regularly.  • Taking a warm bath before bedtime.  • Avoiding going to bed until you’re tired.    #6: If you experience difficulty tolerating  the air pressure try using your machine’s “ramp” feature.     This will allow you to gradually increase air pressure over time. You can also “re-set the ramp” feature if you are waking up to high pressures during the night.     #7: Once you’ve adjusted to your CPAP machine use it consistently - every time you sleep - including for naps.    #8: If you regularly suffer with a dry or stuffy nose you should consider increasing the humidifier level on your CPAP device.     You may also need to increase the temperature on your tubing to prevent condensation from the increased humidity. Your doctor may also recommend an over-the-counter nasal steroid spray for you or you can swab your nasal passages with nasal saline gel. You may also use a nasal wash, such as a sinus rinse bottle or Neti-pot. Avoid the use of petroleum jelly- based products in your nose.    #9: Spend a few minutes each day cleaning your equipment    For cleaning we suggest using liquid dish detergent and warm water. Avoid using hot water. Always make sure you remove all of the soap. Most manufacturers of CPAP masks include cleaning instructions in the packaging as well. After cleaning your tubes and hoses in warm soapy water and rinsing them thoroughly, you can   let them air dry by hanging them over a towel rack or shower rod. The use of ozone or UV light CPAP cleaners is not recommended by the CPAP manufacturer or the FDA.    #10: Change your filter once a month and any time you notice the filter is changing colors.      #11: Try using zinc oxide ointment for soothing minor skin irritations.     #12: If you are experiencing any difficulty adjusting to using CPAP at home, please don’t hesitate to call us at [phone] or contact your sleep physician or PA via MyChart. We have CPAP specialists who can help troubleshoot any issues that may come up.     [facility]:   HOW TO ADJUST HUMIDITY AND TUBE TEMPERATURE SETTINGS:         How do I find  the Climate Control Manual mode?  AirSense 10™ users:   You can access the Climate Control Manual setting anytime. From your machine’s Home screen:  1. Select My Options  2. Select Climate Ctrl  3. Change default “Auto” setting to Manual  4. Select Humidity Level and turn the dial to change the humidity (1-8; default setting is 4)  5. Select Tube Temp and turn the dial to set the ClimateLine/ClimateLineAir heated tube to the temperature you find most comfortable (60-86?F; default setting is 81?F).  For more details, see your ClimateLineAir user guide. You can also find helpful videos on YouTube that will walk you through the process    S9 users:   Your equipment supplier must turn on your Climate Control Manual setting for you if they haven’t already. From your machine’s Home screen:  1. For humidity: Turn the dial to highlight the water drop icon; push the dial, turning the background yellow. Then turn the dial again to set the humidity (1-6, default setting is 3). Push the dial once more to set the new humidity level.  2. For tube temperature: Turn the dial to highlight the thermometer icon; push the dial, turning that background yellow. Then turn the dial again to set the ClimateLine/ClimateLineAir tube temperature (60-86?F, default setting is 80?F). Push the dial once more to set the new temp.    When should I change the humidity vs. the temperature?  Most patients find cooler air easier to breathe while trying to sleep, especially those who are new to CPAP, wear a full face CPAP mask, and women experiencing hot flashes at bedtime. However, warmer air provides the best humidity and helps reduce nasal irritation since your nose doesn’t have to warm all that CPAP air on its own. It generally helps to increase:  • Humidity if dry air is causing you to wake up with dry mouth, an uncomfortable side effect that 40% of CPAP users experience.1 If you want more humidity, try manually adjusting it and the temperature one  notch at a time. If you reach the highest levels and still feel dry, talk to your equipment supplier and doctor about whether mask leak, oral medications or other factors may be the real cause of your dryness. (You should also check for mask leak if you wake up to find your humidifier’s water chamber empty.)   • Tube temperature if you’re experiencing dryness despite increasing the humidity. Consider increasing your tube temperature by just 1-2? F to see if that provides the best comfort.  Source: resmed.com    Link to a video that shows you how to make adjustments to   the humidity and tube temperature on the ResMed CPAP unit:     https://youtu.be/[email or web address]    Rules of Replacement     The life of CPAP supplies depends on the brand and style you buy, how well you care for them, and how often you use them. Follow cleaning instructions carefully to keep them looking and working their best.     Even if your CPAP supplies sparkle, it doesn’t mean they’ll last forever. Even with the best cleaning, parts can eventually grow bacteria and make you sick if they’re not replaced regularly. That’s why most masks and accessories follow some general rules for replacement. You also should check with your supplier for specific details about your mask.     Here are some general rules for replacing your CPAP supplies:    What: Air Filters  Why: Filters can wear out or become clogged over time, potentially exposing you to airborne particles and mold.  When: As soon as it becomes discolored or at least every 60 days.     * Most filters are disposable and are not meant to be cleaned, but discarded when dirty. If you are using an older device, you may have a non-disposable filter which should be cleaned as soon as it becomes discolored and replaced every 6-12 months.      * Keep a closer eye on your filter in the fall and winter -- they tend to get dustier when the heat is on and the windows are closed. If you live in a house with pets  or smoke, you may need to replace the filters more often.    What: Mask cushion/nasal pillow  Why: The material will soften with wear and eventually becomes too soft to hold a seal. This can lead to increased mask leaks which may disrupt your sleep and reduce the effectiveness of CPAP therapy.   When: Every 6-8 weeks. Consider replacing your cushion/pillow sooner if your straps need to be tightened to get the same mask seal.     What: Headgear  Why: The headgear straps lose elasticity over time and must be tightened more and more to achieve the same quality seal. Overtightening can cause facial sores and make treatment uncomfortable.   When: Every 6-12 months.    What: Hose/Tubing   Why: The hose may develop small holes or tears, which can cause air leaks. If your hose is leaking, you may not be receiving your prescribed therapy setting from your CPAP. This can cause you to feel like you’re not sleeping as well.  When: About every 12 months.    * Signs of wear to your hose include dry, cracked places on the inside lining or on the rubber ends; "stretch marks" near the rubber ends; mineral deposits or mold from water left inside the hose; or a visible puncture or tear in the material.     What: Water Chamber  Why: Water chambers may become discolored, cracked, cloudy, or even pitted due to the mineral levels found in most tap and drinking water. As the material deteriorates, cracks may trap bacteria from moisture.  When: About every 12 months.     Who’s got you covered?    Medicare and most private insurance plans cover the supplies recommended above. Ask your durable medical equipment supplier about your specific coverage. Some equipment suppliers use automatic systems for replacement that can save you extra work and worry. They’ll check the type of supplies your plan replaces, how often they’ll replace them,   and then they’ll ship them to you automatically. If your company doesn’t have a plan like this, see if they  can email or call you with a reminder to replace your supplies. If you have higher co-pays or pay out of pocket, ask your supplier about getting a mask with removable parts. It can be cheaper to replace single parts than having to replace the whole mask. By using clean, fresh, up-to-date CPAP supplies, you’ll stay healthier, sleep deeper, and breathe easier.    Types of CPAP Masks    Adjusting to CPAP masks can take time. Often times, people have to try more than one style of mask before findings the perfect fit. There are three main types of masks: nasal pillows, nasal masks, and full face masks.    Nasal pillows rest at the entrance of the nostrils below the nose.  Pros:  • Provide a feeling of openness and freedom, which helps you sleep more comfortably with fewer interruptions.   • Keeps your field of vision clear--beneficial for those who wear glasses, or like reading or watching television before going to bed.   • Causes less air leakage due to direct airflow into the nasal passages.   • Great for patients with lots of facial hair who have trouble getting a seal with a standard CPAP mask that fits over the nose or nose and mouth.   • Reduces the feeling of claustrophobia due to having minimal coverage over the face.  Cons:  • Causes discomfort when used under higher-pressure due to direct airflow into nostrils.   • May cause nosebleeds and dryness of the nose.   • Patients with sensitive skin may develop an allergic reaction to the plastic.   • Using ill-fitting nasal pillows could lead to pressure ulcers within the nostrils.  • Nasal pillows are not the best option for mouth breathers    Nasal masks cover the nose and are lighter and smaller than full face masks, but provide better coverage than a nasal pillow.  Pros:  • Works best if you mostly breathe through your nose only.   • Nasal cushions create a good seal around your nose, while keeping a small profile. The masks also come with forehead support,  which gives you stability and support for your mask.   • Provide more natural airflow than nasal pillows, as the delivered pressure isn't as direct.   • Better for higher-pressure settings than nasal pillows.   • Offers flexible fit. There are many different styles cater to a wide range of facial structures and features.  Cons:  • Nasal masks are not the best option for mouth breathers.   • May cause red marks around the nose due to tightening the mask too much.   • May cause extra discomfort if worn by those who have sinus issues that are allergy or cold-related.    Full face masks involve both the nose and mouth. Traditional full face masks are triangular shaped and cover both the nose and the mouth. A hybrid full face mask sits under the nose and covers the mouth. The hybrid style masks tend to be smaller and can be useful if you are experiencing claustrophobia but require a full face mask.   Pros:  • Great for mouth breathers.   • Great for those who suffer from frequent incidents of dry mouth from CPAP   • Effectively prevents mask leakage from the mouth for those who sleep on their back.   •   Works best with patients who require higher CPAP pressure levels. Due to the larger surface area of the mask, higher pressures do not make you feel uncomfortable.   • Ideal for some patients who find CPAP masks to be claustrophobic because the full-face mask touches the outside of the face instead of the upper lip and or the bridge of the nose.   Cons:  • Due to the mask’s larger surface area, there is a higher chance of air leakage.   • May cause dry, irritated eyes.   • Difficult to watch TV, read or wear glasses while wearing the mask.   • Not great for stomach sleepers due to the bulk of the mask.    * adapted from [employer] - Website      Top Ten Mask Issues and How to Solve Them      1. How do I get used to wearing a CPAP mask?  You need to take small steps to get accustomed to  wearing your CPAP mask.  Try wearing the mask during the day when you're watching TV or reading a book. Sometimes simply wearing the mask while you're cooking or even surfing the Internet can help you get used to wearing it at night.  Once you become accustomed to how the mask feels on your face, start wearing the CPAP mask everytime you sleep at night, and even during naps.  The reality is that the less you wear the mask, the harder it will be to get used to wearing it. So use the device for several weeks or more to see if the mask and pressure settings you were prescribed still work for you.    2. My CPAP mask is uncomfortable to wear at night!  When it comes to getting a new CPAP mask, it's important that you work closely with your doctor and CPAP supplier to make sure that the mask and device suit your needs and that it fits you properly.  Ask your provider, sleep technologist, or CPAP supplier to show you how to adjust your mask to get the best fit and read manufacturer product instructions, which can also help you get a better idea about proper fit.  The good news is that many mask styles are available. Schedule a time to meet with your CPAP supplier for a mask fitting to discuscs the styles available to you.    3. Am I allergic to my CPAP mask?  Here tips on how you can check whether you are allergic to your CPAP mask:  • First, stop wearing the CPAP mask and immediately contact your physician. Usually an allergic reaction to a CPAP mask will occur the same night you wear it.   • Ask yourself how frequently you clean your mask. Almost 9 out of 10 times, what appears to be an allergic reaction to a CPAP mask (such as a bruise on the face or a skin infection) is caused by infrequent cleaning of the mask.   • Check whether your mask is an old version made with latex. The majority of the CPAP masks in the market today are made from silicone, and a few are made from some type of gel material. Almost all are latex  free.    4. I can't tolerate the forced air from the CPAP mask.  You may be able to overcome this issue by using the "ramp" feature on the CPAP machine.  The "ramp" feature allows you   to start with low air pressure, which is then followed by an automatic, gradual increase that eventually sets itself to the pressure you were prescribed by your doctor. The rate of this "ramp" feature can be adjusted by your doctor as well.     5. My nose is running or stuffy after wearing the CPAP mask!  First, check if your CPAP device comes with a heated humidifier. Usually these symptoms can be alleviated by the use of humidifier. If your CPAP machine does not have one, consider getting one that allows you to adjust the level of humidification.  Consider using a nasal saline spray at bedtime to prevent your nose from overdrying. And finally, make sure that your mask is actually fitting well; a leaky mask can dry out your nose.     6. I feel claustrophobic when I'm wearing the CPAP mask.  Start off by having a positive attitude about your CPAP treatment.  You may not know it, but the CPAP machine and mask is there to improve the quality of your life; significantly in the long run.  Follow our advice on getting used to wearing your CPAP mask and above all, keep in mind that successful CPAP therapy sometimes requires patience as you adjust to therapy.   • Practice wearing your CPAP mask while you're awake. Practice by first just holding the mask up to your face without any of the other parts attached. Once you're comfortable with that, try wearing the mask with the straps.   • Take small steps to get used to the CPAP mask. Try holding the mask with the hose connected to your face, without using the straps. Have the hose attached to the CPAP machine at a low-pressure setting (with ramp feature turned on). And, finally, wear the mask with the straps and with the air pressure machine turned on while awake. After you're comfortable with  that, try sleeping with it on.   • Try relaxation exercises. Certain exercises, such as progressive muscle relaxation, also may help reduce your anxiety about wearing your CPAP mask. It may help to get a different size mask or try a different style, such as one that uses nasal pillows.    7. I can't fall asleep easily with the CPAP mask on.  This is a normal, temporary problem that occurs most often with patients new to CPAP therapy. Follow our advice on getting used to your CPAP machine and try out the "ramp" feature of your machine.  Also make sure that you're practicing good sleep hygeine, which includes: exercising regularly and avoiding caffeine and alcohol before bedtime.    8. Why do I have dry mouth after wearing my CPAP mask?  If you breathe through your mouth at night or sleep with your mouth open, CPAP may worsen dry mouth. A chin strap may help keep your mouth closed and reduce the air leak if you wear a nasal mask. But once again, make sure that you're wearing the right kind of mask and try adjusting your CPAP machine's heated humidifier to see if that helps.     9. I keep on taking my CPAP mask off at night while sleeping.  It's normal to sometimes wake up to find that you've removed the mask in your sleep. If you move a lot in your sleep, you may find that a full face mask will stay on your face better. You may be pulling off the mask because your nose is congested. If so, ensuring   a good mask fit and adding a heated humidifier to your CPAP machine may help. A chin strap also may help you to keep the device on your face.  If this is a consistent problem, consider setting an alarm for sometime in the night, to check whether the device is still on. You could progressively set the alarm for later in the night if you find you're keeping the device on longer.    10. Why is the CPAP machine so loud?  The CPAP machine is most likely a lot quieter than your snoring, but if noise is a problem, you have several  options.  Most new models of CPAP devices are almost silent. But if you find a device's noise is bothersome, first check to make sure the device air filter is clean and unblocked. Something in its way may be contributing to noise.   If this doesn't help, have your doctor, sleep technologist, or CPAP supplier check the device to ensure it's working properly. If the device is working correctly and the noise still bothers you, try wearing earplugs or getting a fan to turn on at night to make"white noise" that can also help to disguise the noise of the CPAP machine.      * adapted from [employer] - Website

## 2020-01-31 ENCOUNTER — Telehealth: Payer: Self-pay | Admitting: Family

## 2020-01-31 DIAGNOSIS — Z01812 Encounter for preprocedural laboratory examination: Secondary | ICD-10-CM

## 2020-01-31 NOTE — Telephone Encounter (Signed)
Telephone call to patient, informed of new policy Covid testing pre-procedure prior to PFT. Instructed patient to go to pre-procedure Covid site for testing on 10/9-10/11 for 10/14 test. Will send mychart message to patient with directions and testing site locations. Patient unavailable, left detailed message.

## 2020-02-05 ENCOUNTER — Telehealth: Payer: Self-pay | Admitting: Pulmonology

## 2020-02-05 NOTE — Telephone Encounter (Signed)
Copied from CRM [id number]. Topic: Appointments - Cancel Appointment  >> Feb 05, 2020  4:03 PM Andria Frames wrote:  Patient did not get covid 19 testing  for SDL scheduled due to out of town and will keep FUV and have Dr. Lequita Halt advise if testing needs to be resheduled

## 2020-02-06 ENCOUNTER — Ambulatory Visit: Payer: Medicare (Managed Care) | Admitting: Pulmonology

## 2020-02-06 ENCOUNTER — Other Ambulatory Visit: Payer: Medicare (Managed Care)

## 2020-02-06 ENCOUNTER — Telehealth: Payer: Self-pay | Admitting: Pulmonology

## 2020-02-06 NOTE — Telephone Encounter (Signed)
Copied from CRM [id number]. Topic: Appointments - Reschedule Appointment  >> Feb 06, 2020  8:08 AM Dorcas Mcmurray wrote:  Ms. Jester is calling to cancel her appointment which is currently scheduled for 10/14.    Reason for the cancellation: Patient is running a fever    Has the appointment been cancelled? yes    Has the appointment been rescheduled? yes    Date of new appointment?11/12     Does the patient need a call back to reschedule?no    Patient can be contacted back at [phone] (home)

## 2020-02-06 NOTE — Telephone Encounter (Signed)
LM for patient to inform her that the SDL was added to her next appointment on 03/06/20. She's scheduled for the SDL at 2:30 and Kristin at 3:00. Patient was asked to call the office if she has any questions.

## 2020-02-27 ENCOUNTER — Telehealth: Payer: Self-pay

## 2020-02-27 DIAGNOSIS — Z01812 Encounter for preprocedural laboratory examination: Secondary | ICD-10-CM

## 2020-02-27 NOTE — Telephone Encounter (Signed)
Telephone call to patient, informed of new policy Covid testing pre-procedure prior to PFT. Instructed patient to go to pre-procedure Covid site for testing on 03/01/20-03/02/20 for 03/06/20 test. Will send mychart message to patient with directions and testing site locations. Patient agreed.

## 2020-03-06 ENCOUNTER — Other Ambulatory Visit: Payer: Medicare (Managed Care)

## 2020-03-06 ENCOUNTER — Ambulatory Visit: Payer: Medicare (Managed Care) | Admitting: Family

## 2020-04-13 ENCOUNTER — Telehealth: Payer: Self-pay | Admitting: Family

## 2020-04-13 NOTE — Telephone Encounter (Signed)
Left Vm for patient Regarding  appointment change.

## 2020-04-29 ENCOUNTER — Ambulatory Visit: Payer: Medicare (Managed Care)

## 2020-04-29 ENCOUNTER — Other Ambulatory Visit
Admission: RE | Admit: 2020-04-29 | Discharge: 2020-04-29 | Disposition: A | Discharge status: Home / Self Care | Payer: Medicare (Managed Care) | Source: Ambulatory Visit | Attending: Ophthalmology | Admitting: Ophthalmology

## 2020-04-29 DIAGNOSIS — Z20822 Contact with and (suspected) exposure to covid-19: Secondary | ICD-10-CM | POA: Insufficient documentation

## 2020-04-29 DIAGNOSIS — Z01812 Encounter for preprocedural laboratory examination: Secondary | ICD-10-CM

## 2020-04-29 DIAGNOSIS — Z20828 Contact with and (suspected) exposure to other viral communicable diseases: Secondary | ICD-10-CM | POA: Insufficient documentation

## 2020-04-30 LAB — COVID-19 NAAT (PCR): COVID-19 NAAT (PCR): NEGATIVE

## 2020-04-30 LAB — COVID-19 PCR

## 2020-05-13 ENCOUNTER — Ambulatory Visit: Payer: Medicare (Managed Care)

## 2020-05-13 ENCOUNTER — Other Ambulatory Visit
Admission: RE | Admit: 2020-05-13 | Discharge: 2020-05-13 | Disposition: A | Discharge status: Home / Self Care | Payer: Medicare (Managed Care) | Source: Ambulatory Visit | Attending: Ophthalmology | Admitting: Ophthalmology

## 2020-05-13 DIAGNOSIS — Z20822 Contact with and (suspected) exposure to covid-19: Secondary | ICD-10-CM | POA: Insufficient documentation

## 2020-05-13 DIAGNOSIS — Z20828 Contact with and (suspected) exposure to other viral communicable diseases: Secondary | ICD-10-CM | POA: Insufficient documentation

## 2020-05-13 LAB — COVID-19 PCR

## 2020-05-13 LAB — COVID-19 NAAT (PCR): COVID-19 NAAT (PCR): NEGATIVE

## 2020-05-27 ENCOUNTER — Ambulatory Visit: Payer: Medicare (Managed Care) | Admitting: Family

## 2020-05-27 ENCOUNTER — Other Ambulatory Visit: Payer: Medicare (Managed Care)

## 2020-06-12 ENCOUNTER — Other Ambulatory Visit: Payer: Self-pay | Admitting: Pulmonary Disease

## 2020-06-12 DIAGNOSIS — Z01812 Encounter for preprocedural laboratory examination: Secondary | ICD-10-CM

## 2020-06-18 ENCOUNTER — Telehealth: Payer: Self-pay | Admitting: Pulmonary Disease

## 2020-06-18 ENCOUNTER — Telehealth: Payer: Self-pay

## 2020-06-18 NOTE — Telephone Encounter (Signed)
Copied from CRM #[id number]. Topic: Appointments - Cancel Appointment  >> Jun 18, 2020  4:15 PM Andria Frames wrote:  Patient cancelled her SdL on 06/25/2020 due to just had sinus surgery and unable to do covid 19 test. Patient wanted to keep FUV with Meg M for 06/25/2020.

## 2020-06-18 NOTE — Telephone Encounter (Signed)
Telephone call to patient, informed of new policy Covid testing pre-procedure prior to PFT. Instructed patient to go to pre-procedure Covid site for testing on 2/26-2/28 for 3/3 test. Will send mychart message to patient with directions and testing site locations. Patient unavailable, left detailed message.

## 2020-06-23 NOTE — Progress Notes (Deleted)
[facility]-Up Note      Dear Dr. Ardell Isaacs,    I had the pleasure of seeing Betty Ray in follow-up today at the [facility] for shortness of breath and obstructive sleep apnea.  Betty Ray was last seen via telephone visit on April 04, 2019.     At her last visit a prescription was sent in for her to get back on her CPAP.  No medication changes were made.    Interm History     In reviewing her history     Prednisone  abx    She has not needed to be on oral steroids since her last visit or required antibiotics for upper respiratory infection.        She had a sleep study done Sep 16, 2019 which showed mild obstructive sleep apnea (uAHI 10.5 with oxygen saturation nadir 85%).  She was started back on CPAP.  She continues to use CPAP on a nightly basis.  She does feel rested in the morning when she wakes up.  She is followed by the sleep center.    She is followed by cardiology, Dr. Garry Heater at [facility].    She has received her full COVID vaccination [employer]) along with the booster in October 2021.  She also received a flu shot for the season.    ROS:    Cough:   Daily               Mucus production:  Thick yellowish color.                 Hemoptysis: Denies   Shortness of breath: She does not really notice the shortness of breath at rest but will notice the palpitations.  She said any exertional activity no matter what she is doing she notices the shortness of breath and rapid heart rate.  She said she is not sure which occurs first.  She is not able to carry anything more than 5 pounds because of the shortness of breath and then will start to feel dizzy.  She said she only showers when there is someone at home.  Chest tightness: Less than it was but comes and goes.   Chest pain: Denies   Wheezing: A little and comes and goes.   Nocturnal symptoms: Stopped when she started using the CPAP but now is occurring 3-4 times a night and she is more restless.    Fevers: Denies      Sinus:                 Drainage:  Denies                          PND:  Yes and tends to occur more in the winter.  She does have sinus congestion.  She takes Flonase 2 squirts each nostril twice a day.     Allergy symptoms  -          Allegra once a day and Sudafed 4 times a day.   She does have sneezing fits and itchy eyes.        Heartburn: Denies and denies any acid taste backing up into her mouth.  She takes  pantoprazole 40 mg twice a day before breakfast and supper.  She said for a while it was 4 times a day.  She is followed by a gastroenterologist.  Appetite: Stable   Unwanted Weight loss:  Denies      Urgent Care Visits: Denies r/t breathing issues since the last visit.   ED Visits / Hospitalizations: Denies r/t breathing issues since the last visit.     A complete medication list was reviewed and updated with the patient.    Medications     Current Outpatient Medications   Medication    CPAP machine (HCPCS [phone])    insulin regular (HUMULIN R,NOVOLIN R) 100 unit/mL injection vial    insulin NPH (HUMULIN N,NOVOLIN N) 100 UNIT/ML injection vial    FLUoxetine (PROZAC) 40 MG capsule    torsemide (DEMADEX) 10 MG tablet    losartan (COZAAR) 25 MG tablet    nadolol (CORGARD) 20 MG tablet    tiZANidine (ZANAFLEX) 4 MG tablet    ALPRAZolam (XANAX) 0.5 MG tablet    warfarin (COUMADIN) 4 MG tablet    pantoprazole (PROTONIX) 40 MG EC tablet    atorvastatin (LIPITOR) 20 MG tablet    diclofenac (VOLTAREN) 1 % gel    ACCU-CHEK AVIVA test strip    NOVOFINE 32G X 6 MM MISC    Accu-Chek Multiclix Lancets lancets    dicyclomine (BENTYL) 20 MG tablet     No current facility-administered medications for this visit.       Pertinent pulmonary medications include: She used to have an albuterol inhaler but has not had one in about a year and it did not help.  She is not on any inhalers.  ***    Allergic reactions were reviewed and updated.    A ROS was performed and pertinent findings  include those mentioned above.     Family History     No family history on file.      Social History        Works:    Pets:   Industrial/occupational exposures:    Smoking history:  Non-smoker   Vaping:   Alcohol intake:    Substance abuse:       Exam     There were no vitals taken for this visit.    Gen:   Pleasant 60 y.o. female in NAD  HEENT: Anicteric, oropharynx clear, without thrush, no sinus tenderness to palpation.  Neck:  No cervical or supraclavicular lymphadenopathy. No thyromegaly.    Resp:  ***   CV:    Regular S1S2,  no murmur,  no pedal edema   GI:  Abd. soft, NT/ND.   Msk: No cyanosis, no clubbing    Neuro: Alert and oriented X 3 without focal findings    Results     The following studies were personally reviewed and discussed with the patient.       Her breathing test was canceled since he just had sinus surgery and was unable to do the COVID-19 test.      Imaging     The following studies were personally reviewed and discussed with the patient.      Indication: Chest pain, PE suspected, high pretest prob.     Comparison: Same day chest radiograph and CT chest 11/06/2018.     Technique: Chest CTA was performed using 1.25 mm collimation from the lung apices through the lung bases after the uneventfulIV administration of 80 mL of Omnipaque 350. Coronal and sagittal reformats were obtained. 3-D reformats were performed.     Findings:  There is no main, lobar, or segmental pulmonary arterial embolus. The thoracic aorta is normal in course and caliber. There is no pericardial effusion. The heart is  not enlarged.     There is a stable 1.3 x 0.9 cm nodule within the right middle lobe (254-270) with a small focus of central cavitation. There is mild biapical scarring. The lungs are otherwise clear. Thetracheobronchial tree is patent. There is no pneumothorax. There   is no pleural effusion. There are no enlarged hilar, mediastinal, or axillary lymph nodes by CT size criteria.     There are  mild degenerative changes of the thoracic spine. The visualized upper abdomen is unremarkable.     Impression:   1. No CT evidence of pulmonary embolism.   2. Stable 1.3 cm right middle lobe nodule. Further follow-up can be obtained as clinically indicated.     Approved by: Althea Charon, MD on 05/18/2019 4:48 PM     The undersigned attending radiologist has personally reviewed the examination and the resident's interpretation thereof, and agrees with the findings. Leah Rossett     Signed by Attending: Larena Sox on 05/18/2019 4:52 PM    Labs     No results for input(s): WBC, HGB, HCT, RBC, PLT in the last 8760 hours.    No results for input(s): NA, K, CL, CO2, UN, CREAT, GFRC, GFRB, GLU, CA, TP, ALB, ALT, AST, ALK, TB in the last 8760 hours.    No flowsheet data found.    Active Problem List     Patient Active Problem List   Diagnosis Code    Mild obstructive sleep apnea W23.76    Diastolic Dysfunction E83.1    Crohn's Disease K50.90    Exertional dyspnea R06.00       Impression     Chelle Cayton is a 60 y.o. female non-smoker with history of exertional dyspnea, right middle lobe lung nodule prior PE and obstructive sleep apnea who ***    Plan     ***            She is aware to contact the office if there are any questions or concerns.    I personally spent *** minutes on the calendar day of the encounter, including pre and post visit work.  {Billing Activities:34487} were personally reviewed and discussed with the patient.     Thank you for allowing [place] to remain involved in the care of your patient. Please don't hesitate to contact us with any questions or concerns.         Sincerely,   Jefm Miles, FNP-C    Electronically signed by Burgess Amor, NP 06/25/2020 8:23 AM    Dictated using Ray voice recognition software.

## 2020-06-24 ENCOUNTER — Telehealth: Payer: Self-pay | Admitting: Pulmonary Disease

## 2020-06-24 NOTE — Telephone Encounter (Signed)
Copied from CRM [id number]. Topic: Appointments - Reschedule Appointment  >> Jun 24, 2020  8:26 AM Diana Eves wrote:  Patient, Betty Ray is calling to cancel her appointment which is currently scheduled for a FUV on March 2nd w/M. Mraz at 9:30 am.    Reason for the cancellation: Patient woke up sick / has high fever    Has the appointment been cancelled? yes    Has the appointment been rescheduled? yes    Date of new appointment?  August 4th @ 8:30 am w/M. Albina Billet     Does the patient need a call back to reschedule?no    Patient can be contacted back at Phone #[phone]

## 2020-06-25 ENCOUNTER — Ambulatory Visit: Payer: Medicare (Managed Care) | Admitting: Pulmonary Disease

## 2020-06-25 ENCOUNTER — Other Ambulatory Visit: Payer: Medicare (Managed Care)

## 2020-11-23 ENCOUNTER — Encounter: Payer: Self-pay | Admitting: Pulmonary Disease

## 2020-11-23 LAB — UNMAPPED LAB RESULTS: Hematocrit (HT): 38 % (ref 34–47)

## 2020-11-24 NOTE — Progress Notes (Unsigned)
[name] Follow-Up Note      Dear Dr. Ardell Isaacs,    I had the pleasure of seeing Betty Ray in follow-up today at the [facility] for dyspnea and obstructive sleep apnea.  Betty Ray was last seen via telephone visit on April 04, 2019.     At her last visit she was instructed to get a repeat chest CT and CPAP was ordered.    Interm History     In reviewing her history     Prednisone  abx    She has not needed to be on oral steroids since her last visit or required antibiotics for upper respiratory infection.       [place] VCD Index:     She had a sleep study done November 2019 which showed mild obstructive sleep apnea (uAHI 7.9 with the lowest O2 saturation at 85).  That time she was started on CPAP but had difficulty with using it.  She had a repeat sleep study done Sep 16, 2019 which again showed mild obstructive sleep apnea (uAHI 10.5, oxygen saturation nadir 85%).  She is followed by the sleep center at her last visit in September 2021.    She is followed by ENT, Dr. Herby Abraham ****    She has been seeing Dr. Garry Heater, cardiology and had a 24-hour Holter monitor.  She said they think she has postural orthostatic tachycardia syndrome (POTS) and it looks like she will be seeing  Dr. Lucendia Herrlich.  It is felt that this is contributing to her shortness of breath.  She said when she gets up she gets dizzy and has passed out.       She has received the COVID vaccination [employer]) with the booster in October 2021.  ??? Second booster.    ROS:  Cough:   Daily               Mucus production:  Thick yellowish color.                 Hemoptysis: Denies   Shortness of breath: She does not really notice the shortness of breath at rest but will notice the palpitations.  She said any exertional activity no matter what she is doing she notices the shortness of breath and rapid heart rate.  She said she is not sure which occurs first.  She is not able to carry anything more than 5 pounds because of the  shortness of breath and then will start to feel dizzy.  She said she only showers when there is someone at home.  Chest tightness: Less than it was but comes and goes.   Chest pain: Denies   Wheezing: A little and comes and goes.   Nocturnal symptoms: Stopped when she started using the CPAP but now is occurring 3-4 times a night and she is more restless.   Fevers: Denies      Sinus:                 Drainage:  Denies                          PND:  Yes and tends to occur more in the winter.  She does have sinus congestion.  She takes Flonase 2 squirts each nostril twice a day.   Allegra once a day and Sudafed 4 times a day.   She does have sneezing fits and itchy eyes.  Heartburn: Denies and denies any acid taste backing up into her mouth.  She takes  pantoprazole 40 mg twice a day before breakfast and supper.  She said for a while it was 4 times a day.  She is followed by a gastroenterologist.  Appetite: Stable   Unwanted Weight loss:  Denies      Urgent Care Visits: Denies r/t breathing issues since the last visit.   ED Visits / Hospitalizations: Denies r/t breathing issues since the last visit.     A complete medication list was reviewed and updated with the patient.    Medications     Current Outpatient Medications   Medication    CPAP machine (HCPCS [phone])    insulin regular (HUMULIN R,NOVOLIN R) 100 unit/mL injection vial    insulin NPH (HUMULIN N,NOVOLIN N) 100 UNIT/ML injection vial    FLUoxetine (PROZAC) 40 MG capsule    torsemide (DEMADEX) 10 MG tablet    losartan (COZAAR) 25 MG tablet    nadolol (CORGARD) 20 MG tablet    tiZANidine (ZANAFLEX) 4 MG tablet    ALPRAZolam (XANAX) 0.5 MG tablet    warfarin (COUMADIN) 4 MG tablet    pantoprazole (PROTONIX) 40 MG EC tablet    atorvastatin (LIPITOR) 20 MG tablet    diclofenac (VOLTAREN) 1 % gel    ACCU-CHEK AVIVA test strip    NOVOFINE 32G X 6 MM MISC    Accu-Chek Multiclix Lancets lancets    dicyclomine (BENTYL) 20 MG tablet     No current  facility-administered medications for this visit.       Pertinent pulmonary medications include:  She used to have an albuterol inhaler but has not had one in about a year and it did not help.    She does not have a nebulizer.  She is not on any inhalers.***    Allergic reactions were reviewed and updated.    A ROS was performed and pertinent findings include those mentioned above.     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    Gen:   Pleasant 60 y.o. female in NAD  HEENT: Anicteric, oropharynx clear, without thrush, no sinus tenderness to palpation.  Neck:  No cervical or supraclavicular lymphadenopathy. No thyromegaly.    Resp:  ***   CV:    Regular S1S2,  no murmur,  no pedal edema   GI:  Abd. soft, NT/ND.   Msk: No cyanosis, no clubbing    Neuro: Alert and oriented X 3 without focal findings    Results  The following studies were personally reviewed.     Spirometry not done at today's visit.        Imaging     The following studies were personally reviewed and discussed with the patient.      05/18/2019 CT angio:  Indication: Chest pain, PE suspected, high pretest prob.     Comparison: Same day chest radiograph and CT chest 11/06/2018.     Technique: Chest CTA was performed using 1.25 mm collimation from the lung apices through the lung bases after the uneventfulIV administration  of 80 mL of Omnipaque 350. Coronal and sagittal reformats were obtained. 3-D reformats were performed.     Findings:  There is no main, lobar, or segmental pulmonary arterial embolus. The thoracic aorta is normal in course and caliber. There is no pericardial effusion. The heart is not enlarged.     There is a stable 1.3 x 0.9 cm nodule within the right middle lobe ([id number]) with a small focus of central cavitation. There is mild biapical scarring. The lungs are otherwise clear. Thetracheobronchial tree is patent. There is no pneumothorax. There   is no pleural effusion. There are no enlarged hilar, mediastinal, or axillary lymph nodes by CT size criteria.     There are mild degenerative changes of the thoracic spine. The visualized upper abdomen is unremarkable.     Impression:   1. No CT evidence of pulmonary embolism.   2. Stable 1.3 cm right middle lobe nodule. Further follow-up can be obtained as clinically indicated.     Approved by: Althea Charon, MD on 05/18/2019 4:48 PM        Labs     No results for input(s): WBC, HGB, HCT, RBC, PLT in the last 8760 hours.    No results for input(s): NA, K, CL, CO2, UN, CREAT, GFRC, GFRB, GLU, CA, TP, ALB, ALT, AST, ALK, TB in the last 8760 hours.    No flowsheet data found.    Active Problem List     Patient Active Problem List   Diagnosis Code    Mild obstructive sleep apnea I94.85    Diastolic Dysfunction I62.7    Crohn's Disease K50.90    Exertional dyspnea R06.00       Impression     Betty Ray is a 60 y.o. female non smoker with Crohn's disease, likely metabolic syndrome, obesity and a stable RML lung nodule of unclear etiology, obstructive sleep apnea who ***    History of PE on warfarin *****      Plan     ***    This patient was seen in their chronic steady state.         She is aware to contact the office if there are any questions or concerns.    I personally spent *** minutes on the calendar day of the encounter, including pre and post visit  work.  {Billing Activities:34487} were personally reviewed and discussed with the patient.     Thank you for allowing [place] to remain involved in the care of your patient. Please don't hesitate to contact us with any questions or concerns.     Written Instructions     There are no Patient Instructions on file for this visit.      Sincerely,   Jefm Miles, FNP-C    Electronically signed by Burgess Amor, NP 11/26/2020 8:11 AM    Dictated using Dragon voice recognition software.

## 2020-11-25 ENCOUNTER — Encounter: Payer: Self-pay | Admitting: Pulmonary Disease

## 2020-11-26 ENCOUNTER — Ambulatory Visit: Payer: Medicare (Managed Care) | Admitting: Pulmonary Disease

## 2020-11-26 NOTE — Patient Instructions (Signed)
VIEW YOUR HEALTH INFORMATION ONLINE TODAY!    Below is your activation code to sign up for MyChart, [facility]’s free website that allows you to view most test results, send and receive messages from your doctor, renew your prescriptions, request an appointment, and much more!    How Do I Sign Up?    Important:  You must have an email address to sign up for MyChart.    1. Go to [email or web address]  2. Click on the Sign Up (I have a code) link under "New User?" located in the blue box on the left hand side of the screen.    3. Enter your MyChart Activation Code: Activation code not generated  4. Current [facility] MyChart Status: Active  5. Enter your Date of Birth, Zip Code and Phone Number, and click NEXT.  6. Continue following the instructions to complete your MyChart sign up.    • You also may want to write down your Username and Password below:    MY USERNAME:  _______________________________________________    MY PASSWORD: _______________________________________________    Additional Information    If you are not the patient, but you are involved in the health care of this patient, DO NOT USE THIS CODE!  Instead, go to the MyChart website ([email or web address]) and click on “Access for My Kids/Family/Friends” below the username and password fields.    Questions?    Call our MyChart Customer Service Center, 8 a.m. to 5 p.m.   Weekdays: [phone]; choose Option 1

## 2020-11-26 NOTE — Telephone Encounter (Signed)
When she is rescheduled can we add spiro and diffusion.  It was ordered in the past but not done.  She has never had a breathing test since coming to this office and should have a baseline test done.  New orders placed.

## 2020-12-24 ENCOUNTER — Other Ambulatory Visit: Payer: Self-pay | Admitting: Gastroenterology

## 2021-03-02 ENCOUNTER — Other Ambulatory Visit: Payer: Self-pay | Admitting: Pulmonary and Critical Care Medicine

## 2021-03-02 DIAGNOSIS — Z01812 Encounter for preprocedural laboratory examination: Secondary | ICD-10-CM

## 2021-03-03 ENCOUNTER — Telehealth: Payer: Self-pay

## 2021-03-03 NOTE — Telephone Encounter (Signed)
Telephone call to patient. Informed of policy regarding need of COVID testing prior to PFT. Instructed patient to go to pre-procedure COVID site for testing on 11/12,11/13,11/14 for 11/17 test. Will send MyChart message with directions and testing site location. Patient acknowledged need of pre-procedure Covid testing.

## 2021-03-07 ENCOUNTER — Ambulatory Visit: Payer: Medicare (Managed Care) | Attending: Pulmonary and Critical Care Medicine

## 2021-03-07 DIAGNOSIS — Z20822 Contact with and (suspected) exposure to covid-19: Secondary | ICD-10-CM | POA: Insufficient documentation

## 2021-03-07 DIAGNOSIS — Z01812 Encounter for preprocedural laboratory examination: Secondary | ICD-10-CM

## 2021-03-07 LAB — COVID-19 NAAT (PCR): COVID-19 NAAT (PCR): NEGATIVE

## 2021-03-07 LAB — COVID-19 PCR

## 2021-03-11 ENCOUNTER — Ambulatory Visit: Payer: Medicare (Managed Care) | Admitting: Pulmonology

## 2021-03-11 ENCOUNTER — Ambulatory Visit: Payer: Medicare (Managed Care) | Attending: Pulmonology

## 2021-03-11 ENCOUNTER — Other Ambulatory Visit: Payer: Self-pay

## 2021-03-11 VITALS — BP 123/75 | HR 78 | Ht 67.0 in | Wt 214.0 lb

## 2021-03-11 DIAGNOSIS — G4733 Obstructive sleep apnea (adult) (pediatric): Secondary | ICD-10-CM | POA: Insufficient documentation

## 2021-03-11 DIAGNOSIS — I519 Heart disease, unspecified: Secondary | ICD-10-CM | POA: Insufficient documentation

## 2021-03-11 DIAGNOSIS — R0600 Dyspnea, unspecified: Secondary | ICD-10-CM

## 2021-03-11 LAB — PULMONARY FUNCTION TEST
DLCO Z-score: -1.73
DLCO: 16.2 mL/min/mmHg
FEV1 (L): 2.53 L
FEV1 Z-score: -0.64
FEV1/FVC Z-score: -0.59
FEV1/FVC: 75 %
FVC (L): 3.38 L
FVC Z-score: -0.32

## 2021-03-11 NOTE — Progress Notes (Signed)
PULMONARY CLINIC   FOLLOW-UP VISIT        Dear Dr. Ardell Isaacs,    I had the pleasure of seeing Betty Ray in follow-up today at the [facility] for mild OSA and exertional dyspnea. I have not seen her in more than two years. I saw her at that time as a second opinion on her breathlessness and also initiated treatment for her sleep apnea. She was prescribed CPAP (AHI 7.9). She was seen on one additional occasion by Jefm Miles, NP, who attempted to rearrange CPAP for her since her prior machine had been taken away for nonadherence (mostly owing to poor mask fit).  Her AHI was 10.5 at that time and she has been followed by the Sleep Center since that time.     We have also been following a PET non-avid RML nodule (1x1.5 cm).     Today,  Betty Ray reports that her breathing is similar to that in the past in that she will become acutely short of breath with exertion (including walking around [place], carrying heavier items, etc). Other triggers are cold air. She notices some inspiratory whistling at that time, as well as neck tightness. Stopping and resting will alleviate the symptoms.     She is doing very well with the CPAP and is able to sleep through the night for the first time in years.     She has also been diagnosed with POTS.  She had surgery for nasal polyps earlier in the year and unfortunately is having more difficulty with postnasal drip since that time.     A complete medication list was reviewed and updated with the patient.  Medications/treatments pertinent to today's visit include: none except CPAP    Allergic reactions were reviewed.    Physical Exam:  On exam, vital signs are as follows: Blood pressure 123/75, pulse 78, height 1.702 m ('5\' 7"' ), weight 97.1 kg (214 lb), SpO2 98 %. on RA.  Body mass index is 33.52 kg/m.    In general, the patient is alert, fully oriented, in no acute distress, and is speaking in full sentences without evidence of respiratory limitation.  She does have  occasional stridor over the neck with prolonged conversation or deep inspiration. Lung exam reveals good inspiratory effort, normal and symmetric breath sounds with normal expiratory phase in all lung fields. There are no  wheezes.   Cardiovascular exam reveals regular heart sounds without murmur or jugulovenous distention, and no pedal edema.      Pulmonary function tests: The following studies were personally reviewed.    Spirometry was performed and met ATS criteria. The FET was adequate. The flow volume loop appears normal. FEV1 is normal/intact, FVC is normal/intact, and the ratio is normal. This suggests no airflow obstruction.  DLCO is also within the normal range.     Imaging studies since our last visit:  The following studies were personally reviewed.    CT report from [facility] was reviewed. Stable RML nodule.  Active Problem List:  Patient Active Problem List   Diagnosis Code    Mild obstructive sleep apnea L24.40    Diastolic Dysfunction N02.7    Crohn's Disease K50.90    Exertional dyspnea R06.09       Impression:  Betty Ray is a 60 y.o.  woman with Crohn's disease, mild OSA, nasal polyps and allergies who had an extensive work-up for exertional dyspnea at [facility], followed by a reassessment here. We have been unable to identify a driver --  from a pulmonary perspective -- for her symptoms, and today's pulmonary function assessment was similarly reassuring.     I'm pleased that Betty Ray has adapted well to CPAP, and it does sound as if it has made a difference in terms of restful sleep. She has no indications on physical exam today of pulmonary hypertension or right sided heart dysfunction on exam.      She does endorse several symptoms on the [facility] vocal cord dysfunction index (throat tightness), along with possible stridulous breathing. As a bronchoprovocational test was previously negative (at [facility]), for completeness sake we should proceed with laryngeal assessment to look for any evidence of  vocal cord dysfunction.     At this point, I do not have much to offer Betty Ray from a pulmonary perspective, so will turn her care over to your capable hands. I am of course happy to reassess her as indicated in the future.    Thank you for allowing me to remain involved in the care of your patient. Please don't hesitate to contact me with any questions or concerns.     Sincerely,   Leanne Chang, MD  12:01 PM  03/11/2021

## 2021-07-14 ENCOUNTER — Ambulatory Visit: Payer: Medicare (Managed Care) | Admitting: Otolaryngology
